# Patient Record
Sex: Female | Born: 1935 | Race: White | Hispanic: No | Smoking: Never smoker
Health system: Southern US, Community
[De-identification: ages and names within clinical notes are randomized; demographics above are authoritative.]

## PROBLEM LIST (undated history)

## (undated) DIAGNOSIS — E785 Hyperlipidemia, unspecified: Secondary | ICD-10-CM

## (undated) DIAGNOSIS — I10 Essential (primary) hypertension: Secondary | ICD-10-CM

## (undated) DIAGNOSIS — I2510 Atherosclerotic heart disease of native coronary artery without angina pectoris: Secondary | ICD-10-CM

## (undated) HISTORY — DX: Atherosclerotic heart disease of native coronary artery without angina pectoris: I25.10

## (undated) HISTORY — DX: Hyperlipidemia, unspecified: E78.5

---

## 2011-05-15 DIAGNOSIS — D039 Melanoma in situ, unspecified: Secondary | ICD-10-CM | POA: Insufficient documentation

## 2011-05-15 DIAGNOSIS — Z8719 Personal history of other diseases of the digestive system: Secondary | ICD-10-CM | POA: Insufficient documentation

## 2011-05-15 DIAGNOSIS — E785 Hyperlipidemia, unspecified: Secondary | ICD-10-CM | POA: Insufficient documentation

## 2011-06-17 DIAGNOSIS — K8020 Calculus of gallbladder without cholecystitis without obstruction: Secondary | ICD-10-CM | POA: Insufficient documentation

## 2014-02-13 ENCOUNTER — Emergency Department (HOSPITAL_COMMUNITY)
Admission: EM | Admit: 2014-02-13 | Discharge: 2014-02-13 | Disposition: A | Discharge status: Home / Self Care | Payer: Medicare Other | Attending: Emergency Medicine | Admitting: Emergency Medicine

## 2014-02-13 DIAGNOSIS — Z7982 Long term (current) use of aspirin: Secondary | ICD-10-CM | POA: Diagnosis not present

## 2014-02-13 DIAGNOSIS — Z79899 Other long term (current) drug therapy: Secondary | ICD-10-CM | POA: Insufficient documentation

## 2014-02-13 DIAGNOSIS — R55 Syncope and collapse: Secondary | ICD-10-CM | POA: Insufficient documentation

## 2014-02-13 LAB — COMPREHENSIVE METABOLIC PANEL
ALT: 11 U/L (ref 0–35)
AST: 18 U/L (ref 0–37)
Albumin: 4 g/dL (ref 3.5–5.2)
Alkaline Phosphatase: 65 U/L (ref 39–117)
Anion gap: 16 — ABNORMAL HIGH (ref 5–15)
BILIRUBIN TOTAL: 0.5 mg/dL (ref 0.3–1.2)
BUN: 16 mg/dL (ref 6–23)
CHLORIDE: 98 meq/L (ref 96–112)
CO2: 23 meq/L (ref 19–32)
Calcium: 9.2 mg/dL (ref 8.4–10.5)
Creatinine, Ser: 0.74 mg/dL (ref 0.50–1.10)
GFR calc non Af Amer: 75 mL/min — ABNORMAL LOW (ref >90)
GFR, EST AFRICAN AMERICAN: 87 mL/min — AB (ref >90)
GLUCOSE: 126 mg/dL — AB (ref 70–99)
POTASSIUM: 4.1 meq/L (ref 3.7–5.3)
Sodium: 137 mEq/L (ref 137–147)
TOTAL PROTEIN: 7.5 g/dL (ref 6.0–8.3)

## 2014-02-13 LAB — URINE MICROSCOPIC-ADD ON

## 2014-02-13 LAB — URINALYSIS, ROUTINE W REFLEX MICROSCOPIC
Bilirubin Urine: NEGATIVE
Glucose, UA: NEGATIVE mg/dL
Ketones, ur: NEGATIVE mg/dL
Nitrite: NEGATIVE
Protein, ur: NEGATIVE mg/dL
SPECIFIC GRAVITY, URINE: 1.008 (ref 1.005–1.030)
UROBILINOGEN UA: 0.2 mg/dL (ref 0.0–1.0)
pH: 7 (ref 5.0–8.0)

## 2014-02-13 LAB — CBC WITH DIFFERENTIAL/PLATELET
Basophils Absolute: 0 10*3/uL (ref 0.0–0.1)
Basophils Relative: 0 % (ref 0–1)
EOS ABS: 0.1 10*3/uL (ref 0.0–0.7)
Eosinophils Relative: 1 % (ref 0–5)
HCT: 40.4 % (ref 36.0–46.0)
HEMOGLOBIN: 13.4 g/dL (ref 12.0–15.0)
LYMPHS ABS: 1.4 10*3/uL (ref 0.7–4.0)
LYMPHS PCT: 18 % (ref 12–46)
MCH: 31.8 pg (ref 26.0–34.0)
MCHC: 33.2 g/dL (ref 30.0–36.0)
MCV: 96 fL (ref 78.0–100.0)
Monocytes Absolute: 0.5 10*3/uL (ref 0.1–1.0)
Monocytes Relative: 6 % (ref 3–12)
NEUTROS PCT: 75 % (ref 43–77)
Neutro Abs: 6.1 10*3/uL (ref 1.7–7.7)
Platelets: 173 10*3/uL (ref 150–400)
RBC: 4.21 MIL/uL (ref 3.87–5.11)
RDW: 13.1 % (ref 11.5–15.5)
WBC: 8 10*3/uL (ref 4.0–10.5)

## 2014-02-13 LAB — TROPONIN I: Troponin I: <0.3 ng/mL (ref <0.30)

## 2014-02-13 NOTE — Discharge Instructions (Signed)
Followup with primary doctor in the office this week for a recheck. Return to the ER for recurrent episodes of passing out.  Syncope Syncope is a medical term for fainting or passing out. This means you lose consciousness and drop to the ground. People are generally unconscious for less than 5 minutes. You may have some muscle twitches for up to 15 seconds before waking up and returning to normal. Syncope occurs more often in older adults, but it can happen to anyone. While most causes of syncope are not dangerous, syncope can be a sign of a serious medical problem. It is important to seek medical care.  CAUSES  Syncope is caused by a sudden drop in blood flow to the brain. The specific cause is often not determined. Factors that can bring on syncope include:  Taking medicines that lower blood pressure.  Sudden changes in posture, such as standing up quickly.  Taking more medicine than prescribed.  Standing in one place for too long.  Seizure disorders.  Dehydration and excessive exposure to heat.  Low blood sugar (hypoglycemia).  Straining to have a bowel movement.  Heart disease, irregular heartbeat, or other circulatory problems.  Fear, emotional distress, seeing blood, or severe pain. SYMPTOMS  Right before fainting, you may:  Feel dizzy or light-headed.  Feel nauseous.  See all white or all black in your field of vision.  Have cold, clammy skin. DIAGNOSIS  Your health care provider will ask about your symptoms, perform a physical exam, and perform an electrocardiogram (ECG) to record the electrical activity of your heart. Your health care provider may also perform other heart or blood tests to determine the cause of your syncope which may include:  Transthoracic echocardiogram (TTE). During echocardiography, sound waves are used to evaluate how blood flows through your heart.  Transesophageal echocardiogram (TEE).  Cardiac monitoring. This allows your health care  provider to monitor your heart rate and rhythm in real time.  Holter monitor. This is a portable device that records your heartbeat and can help diagnose heart arrhythmias. It allows your health care provider to track your heart activity for several days, if needed.  Stress tests by exercise or by giving medicine that makes the heart beat faster. TREATMENT  In most cases, no treatment is needed. Depending on the cause of your syncope, your health care provider may recommend changing or stopping some of your medicines. HOME CARE INSTRUCTIONS  Have someone stay with you until you feel stable.  Do not drive, use machinery, or play sports until your health care provider says it is okay.  Keep all follow-up appointments as directed by your health care provider.  Lie down right away if you start feeling like you might faint. Breathe deeply and steadily. Wait until all the symptoms have passed.  Drink enough fluids to keep your urine clear or pale yellow.  If you are taking blood pressure or heart medicine, get up slowly and take several minutes to sit and then stand. This can reduce dizziness. SEEK IMMEDIATE MEDICAL CARE IF:   You have a severe headache.  You have unusual pain in the chest, abdomen, or back.  You are bleeding from your mouth or rectum, or you have black or tarry stool.  You have an irregular or very fast heartbeat.  You have pain with breathing.  You have repeated fainting or seizure-like jerking during an episode.  You faint when sitting or lying down.  You have confusion.  You have trouble walking.  You have severe weakness.  You have vision problems. If you fainted, call your local emergency services (911 in [place] Do not drive yourself to the hospital.  MAKE SURE YOU:  Understand these instructions.  Will watch your condition.  Will get help right away if you are not doing well or get worse. Document Released: 06/23/2005 Document Revised: 06/28/2013  Document Reviewed: 08/22/2011 [facility] Patient Information 2015 [place], [place]. This information is not intended to replace advice given to you by your health care provider. Make sure you discuss any questions you have with your health care provider.

## 2014-02-13 NOTE — ED Notes (Signed)
Pt. Had a near syncopal episode while sitting in the chair this am.  Pt. Became diaphoretic and could not respond for approximately 10 minutes.  Reported by her daughter.  Pt. Stated, "I could hear my daughter talking but could not answer her."  When paramedics arrived pt. Was alert and oriented X4.  BP was 58 palpated and HR44-64, 1st degree block.  (pt. Is on Atenolol)  Pt. Received 500 cc bolus BP 137/60 presently.  Pt. Denies any pain is alert and oriented $. CBG 123

## 2014-02-13 NOTE — ED Notes (Signed)
Pt. Is using [facility]

## 2014-02-13 NOTE — ED Provider Notes (Signed)
CSN: [id number]     Arrival date & time 02/13/14  [id number] History   First MD Initiated Contact with Patient 02/13/14 [phone]     Chief Complaint  Patient presents with  . Near Syncope     (Consider location/radiation/quality/duration/timing/severity/associated sxs/prior Treatment) HPI Comments: Patient presents to the ER for evaluation of syncope. Patient has had similar episodes in the past. Daughter reports that the patient was sitting in a chair and suddenly slumped lowered. Each time that this has happened in the past, including today, she has had several yawns just prior to slumping forward. She was incontinent of urine. Daughter reports that she had her head slumped over and was unresponsive for a brief period of time. Patient reports that she could hear her daughter talking, but could not respond. EMS responded to the home, found her alert and oriented but weak. She had a blood pressure of 58 palp and heart rate in the 40s. She was administered IV fluid bolus during transport to the ER.  Upon arrival, patient is without complaints. She is awake, alert and oriented. She denies headache, weakness, dizziness, chest pain, shortness of breath.  Patient is a 80 y.o. female presenting with near-syncope.  Near Syncope    No past medical history on file. No past surgical history on file. No family history on file. History  Substance Use Topics  . Smoking status: Not on file  . Smokeless tobacco: Not on file  . Alcohol Use: Not on file   OB History   No data available     Review of Systems  Cardiovascular: Positive for near-syncope.  Neurological: Positive for syncope.  All other systems reviewed and are negative.     Allergies  Review of patient's allergies indicates no known allergies.  Home Medications   Prior to Admission medications   Medication Sig Start Date End Date Taking? Authorizing Provider  aspirin EC 81 MG tablet Take 81 mg by mouth 3 (three) times a week. No  specific days   Yes Historical Provider, MD  atenolol (TENORMIN) 25 MG tablet Take 25 mg by mouth 2 (two) times daily.   Yes Historical Provider, MD  Cyanocobalamin (VITAMIN B-12) 5000 MCG SUBL Take 5,000 mcg by mouth every Monday.   Yes Historical Provider, MD  hydrochlorothiazide (MICROZIDE) 12.5 MG capsule Take 6.25 mg by mouth daily.   Yes Historical Provider, MD  lovastatin (MEVACOR) 10 MG tablet Take 10 mg by mouth at bedtime.   Yes Historical Provider, MD   BP 143/54  Pulse 62  Temp(Src) 96.2 F (35.7 C) (Rectal)  Resp 18  SpO2 97% Physical Exam  Constitutional: She is oriented to person, place, and time. She appears well-developed and well-nourished. No distress.  HENT:  Head: Normocephalic and atraumatic.  Right Ear: Hearing normal.  Left Ear: Hearing normal.  Nose: Nose normal.  Mouth/Throat: Oropharynx is clear and moist and mucous membranes are normal.  Eyes: Conjunctivae and EOM are normal. Pupils are equal, round, and reactive to light.  Neck: Normal range of motion. Neck supple.  Cardiovascular: Regular rhythm, S1 normal and S2 normal.  Exam reveals no gallop and no friction rub.   No murmur heard. Pulmonary/Chest: Effort normal and breath sounds normal. No respiratory distress. She exhibits no tenderness.  Abdominal: Soft. Normal appearance and bowel sounds are normal. There is no hepatosplenomegaly. There is no tenderness. There is no rebound, no guarding, no tenderness at McBurney's point and negative Murphy's sign. No hernia.  Musculoskeletal: Normal range of motion.  Neurological: She is alert and oriented to person, place, and time. She has normal strength. No cranial nerve deficit or sensory deficit. Coordination normal. GCS eye subscore is 4. GCS verbal subscore is 5. GCS motor subscore is 6.  Skin: Skin is warm, dry and intact. No rash noted. No cyanosis.  Psychiatric: She has a normal mood and affect. Her speech is normal and behavior is normal. Thought content  normal.    ED Course  Procedures (including critical care time) Labs Review Labs Reviewed  COMPREHENSIVE METABOLIC PANEL - Abnormal; Notable for the following:    Glucose, Bld 126 (*)    GFR calc non Af Amer 75 (*)    GFR calc Af Amer 87 (*)    Anion gap 16 (*)    All other components within normal limits  URINALYSIS, ROUTINE W REFLEX MICROSCOPIC - Abnormal; Notable for the following:    Hgb urine dipstick TRACE (*)    Leukocytes, UA SMALL (*)    All other components within normal limits  URINE MICROSCOPIC-ADD ON - Abnormal; Notable for the following:    Squamous Epithelial / LPF FEW (*)    Bacteria, UA FEW (*)    All other components within normal limits  CBC WITH DIFFERENTIAL  TROPONIN I    Imaging Review No results found.   EKG Interpretation   Date/Time:  Monday February 13 2014 09:46:08 EDT Ventricular Rate:  67 PR Interval:  215 QRS Duration: 92 QT Interval:  444 QTC Calculation: 469 R Axis:   29 Text Interpretation:  Sinus rhythm Borderline prolonged PR interval  Otherwise within normal limits Confirmed by Abimael Zeiter  MD, Jazzlyn Huizenga  ([id number]) on 02/13/2014 10:23:48 AM      MDM   Final diagnoses:  Vasovagal syncope    Presented to the for evaluation of her near syncope or syncope. The patient had an episode she slumped over, that is that her head was and she wasn't responding, but patient reports that she could hear what was going on around her. There may have been urinary incontinence. Patient has had at least 2 other episodes similar to this, workup including EEG and MRI of her recent hospital in the last 6 months for these episodes.  Patient reports that every time it has happened it has happened immediately after yawning. Case was discussed with Doctor Amada Jupiter, on call for neurology. Specifically, we discussed whether this would likely represent a seizure or TIA/CVA. He did not feel it was consistent with any of these entities, did not recommend repeat  imaging such as MRI. Patient's cardiac workup has been unremarkable, no signs of arrhythmia. Doctor Amada Jupiter felt that the patient was most likely expressing vasovagal episode secondary to the yawning. The patient's blood pressure and heart rate seen by EMS to support this diagnosis. Patient was reassured, told to followup with primary doctor, possible carotid ultrasounds and further workup, but does not need to be performed emergently. Family was told that she can be brought back to the emergency department if she has any further episodes.   Gilda Crease, MD 02/13/14 [phone]

## 2016-03-03 ENCOUNTER — Ambulatory Visit (INDEPENDENT_AMBULATORY_CARE_PROVIDER_SITE_OTHER): Payer: Medicare Other

## 2016-03-03 ENCOUNTER — Encounter (HOSPITAL_COMMUNITY): Payer: Self-pay | Admitting: Emergency Medicine

## 2016-03-03 ENCOUNTER — Ambulatory Visit (HOSPITAL_COMMUNITY)
Admission: EM | Admit: 2016-03-03 | Discharge: 2016-03-03 | Disposition: A | Discharge status: Home / Self Care | Payer: Medicare Other | Attending: Internal Medicine | Admitting: Internal Medicine

## 2016-03-03 DIAGNOSIS — J209 Acute bronchitis, unspecified: Secondary | ICD-10-CM

## 2016-03-03 HISTORY — DX: Essential (primary) hypertension: I10

## 2016-03-03 MED ORDER — BENZONATATE 100 MG PO CAPS: 100.0000 mg | ORAL_CAPSULE | Freq: Three times a day (TID) | ORAL | 0 refills | Status: DC

## 2016-03-03 MED ORDER — AMOXICILLIN 250 MG PO CAPS: 250.0000 mg | ORAL_CAPSULE | Freq: Three times a day (TID) | ORAL | 0 refills | Status: DC

## 2016-03-03 NOTE — ED Provider Notes (Signed)
CSN: [id number]     Arrival date & time 03/03/16  1308 History   First MD Initiated Contact with Patient 03/03/16 1426     Chief Complaint  Patient presents with  . Cough   (Consider location/radiation/quality/duration/timing/severity/associated sxs/prior Treatment) HPI 82 y/o female with 1 week hx of cough, sputum production, and some sob. Away from home ([place]). Non smoker. No hx of asthma.  Past Medical History:  Diagnosis Date  . Hypertension    History reviewed. No pertinent surgical history. History reviewed. No pertinent family history. Social History  Substance Use Topics  . Smoking status: Never Smoker  . Smokeless tobacco: Never Used  . Alcohol use No   OB History    No data available     Review of Systems  Denies: HEADACHE, NAUSEA, ABDOMINAL PAIN, CHEST PAIN, CONGESTION, DYSURIA, SHORTNESS OF BREATH  Allergies  Review of patient's allergies indicates no known allergies.  Home Medications   Prior to Admission medications   Medication Sig Start Date End Date Taking? Authorizing Provider  atenolol (TENORMIN) 25 MG tablet Take 25 mg by mouth 2 (two) times daily.   Yes Historical Provider, MD  hydrochlorothiazide (MICROZIDE) 12.5 MG capsule Take 6.25 mg by mouth daily.   Yes Historical Provider, MD  lovastatin (MEVACOR) 10 MG tablet Take 10 mg by mouth at bedtime.   Yes Historical Provider, MD  aspirin EC 81 MG tablet Take 81 mg by mouth 3 (three) times a week. No specific days    Historical Provider, MD  Cyanocobalamin (VITAMIN B-12) 5000 MCG SUBL Take 5,000 mcg by mouth every Monday.    Historical Provider, MD   Meds Ordered and Administered this Visit  Medications - No data to display  BP 156/63 (BP Location: Left Arm)   Pulse 67   Temp 98 F (36.7 C) (Oral)   Resp 16   SpO2 98%  No data found.   Physical Exam NURSES NOTES AND VITAL SIGNS REVIEWED. CONSTITUTIONAL: Well developed, well nourished, no acute distress HEENT: normocephalic,  atraumatic EYES: Conjunctiva normal NECK:normal ROM, supple, no adenopathy PULMONARY:No respiratory distress, normal effort, few non-specific rhonchi.  ABDOMINAL: Soft, ND, NT BS+, No CVAT MUSCULOSKELETAL: Normal ROM of all extremities,  SKIN: warm and dry without rash PSYCHIATRIC: Mood and affect, behavior are normal  Urgent Care Course   Clinical Course    Procedures (including critical care time)  Labs Review Labs Reviewed - No data to display  Imaging Review No results found.   Visual Acuity Review  Right Eye Distance:   Left Eye Distance:   Bilateral Distance:    Right Eye Near:   Left Eye Near:    Bilateral Near:       Amoxil 250 mg 3 times a day, push fluids Tessalon perles.   MDM   1. Acute bronchitis, unspecified organism     Patient is reassured that there are no issues that require transfer to higher level of care at this time or additional tests. Patient is advised to continue home symptomatic treatment. Patient is advised that if there are new or worsening symptoms to attend the emergency department, contact primary care provider, or return to UC. Instructions of care provided discharged home in stable condition.    THIS NOTE WAS GENERATED USING A VOICE RECOGNITION SOFTWARE PROGRAM. ALL REASONABLE EFFORTS  WERE MADE TO PROOFREAD THIS DOCUMENT FOR ACCURACY.  I have verbally reviewed the discharge instructions with the patient. A printed AVS was given to the patient.  All questions were  answered prior to discharge.      Tharon AquasFrank C Patrick, PA 03/03/16 [phone]

## 2016-03-03 NOTE — ED Triage Notes (Signed)
[facility] a [facility] with chest congestion x 6 days.

## 2019-06-04 ENCOUNTER — Encounter (HOSPITAL_COMMUNITY): Payer: Self-pay | Admitting: Emergency Medicine

## 2019-06-04 ENCOUNTER — Inpatient Hospital Stay (HOSPITAL_COMMUNITY)
Admission: EM | Admit: 2019-06-04 | Discharge: 2019-06-08 | DRG: 247 | Disposition: A | Discharge status: Home / Self Care | Payer: Medicare Other | Attending: Family Medicine | Admitting: Family Medicine

## 2019-06-04 ENCOUNTER — Other Ambulatory Visit: Payer: Self-pay

## 2019-06-04 ENCOUNTER — Emergency Department (HOSPITAL_COMMUNITY): Payer: Medicare Other

## 2019-06-04 DIAGNOSIS — E871 Hypo-osmolality and hyponatremia: Secondary | ICD-10-CM | POA: Diagnosis present

## 2019-06-04 DIAGNOSIS — E7800 Pure hypercholesterolemia, unspecified: Secondary | ICD-10-CM

## 2019-06-04 DIAGNOSIS — Z955 Presence of coronary angioplasty implant and graft: Secondary | ICD-10-CM

## 2019-06-04 DIAGNOSIS — I959 Hypotension, unspecified: Secondary | ICD-10-CM | POA: Diagnosis present

## 2019-06-04 DIAGNOSIS — Z79899 Other long term (current) drug therapy: Secondary | ICD-10-CM

## 2019-06-04 DIAGNOSIS — Z20828 Contact with and (suspected) exposure to other viral communicable diseases: Secondary | ICD-10-CM | POA: Diagnosis present

## 2019-06-04 DIAGNOSIS — Z7982 Long term (current) use of aspirin: Secondary | ICD-10-CM

## 2019-06-04 DIAGNOSIS — I252 Old myocardial infarction: Secondary | ICD-10-CM

## 2019-06-04 DIAGNOSIS — I214 Non-ST elevation (NSTEMI) myocardial infarction: Principal | ICD-10-CM | POA: Diagnosis present

## 2019-06-04 DIAGNOSIS — E785 Hyperlipidemia, unspecified: Secondary | ICD-10-CM | POA: Diagnosis present

## 2019-06-04 DIAGNOSIS — R079 Chest pain, unspecified: Secondary | ICD-10-CM

## 2019-06-04 DIAGNOSIS — R002 Palpitations: Secondary | ICD-10-CM

## 2019-06-04 DIAGNOSIS — I10 Essential (primary) hypertension: Secondary | ICD-10-CM | POA: Diagnosis present

## 2019-06-04 LAB — BASIC METABOLIC PANEL
Anion gap: 12 (ref 5–15)
BUN: 18 mg/dL (ref 8–23)
CO2: 24 mmol/L (ref 22–32)
Calcium: 9.4 mg/dL (ref 8.9–10.3)
Chloride: 96 mmol/L — ABNORMAL LOW (ref 98–111)
Creatinine, Ser: 0.9 mg/dL (ref 0.44–1.00)
GFR calc Af Amer: >60 mL/min (ref >60)
GFR calc non Af Amer: 56 mL/min — ABNORMAL LOW (ref >60)
Glucose, Bld: 168 mg/dL — ABNORMAL HIGH (ref 70–99)
Potassium: 3.9 mmol/L (ref 3.5–5.1)
Sodium: 132 mmol/L — ABNORMAL LOW (ref 135–145)

## 2019-06-04 LAB — CBC
HCT: 36.2 % (ref 36.0–46.0)
Hemoglobin: 12.4 g/dL (ref 12.0–15.0)
MCH: 33.5 pg (ref 26.0–34.0)
MCHC: 34.3 g/dL (ref 30.0–36.0)
MCV: 97.8 fL (ref 80.0–100.0)
Platelets: 200 10*3/uL (ref 150–400)
RBC: 3.7 MIL/uL — ABNORMAL LOW (ref 3.87–5.11)
RDW: 12.6 % (ref 11.5–15.5)
WBC: 6.3 10*3/uL (ref 4.0–10.5)
nRBC: 0 % (ref 0.0–0.2)

## 2019-06-04 LAB — TROPONIN I (HIGH SENSITIVITY): Troponin I (High Sensitivity): 16 ng/L (ref <18)

## 2019-06-04 MED ORDER — SODIUM CHLORIDE 0.9% FLUSH: 3.0000 mL | Freq: Once | INTRAVENOUS | Status: DC

## 2019-06-04 NOTE — ED Triage Notes (Signed)
Pt c/o 7/10 mid cp and tachycardia for the past few hours, denies any SOB, nausea or vomiting.

## 2019-06-05 ENCOUNTER — Other Ambulatory Visit: Payer: Self-pay

## 2019-06-05 ENCOUNTER — Observation Stay (HOSPITAL_BASED_OUTPATIENT_CLINIC_OR_DEPARTMENT_OTHER): Payer: Medicare Other

## 2019-06-05 ENCOUNTER — Encounter (HOSPITAL_COMMUNITY): Payer: Self-pay | Admitting: Cardiovascular Disease

## 2019-06-05 DIAGNOSIS — I214 Non-ST elevation (NSTEMI) myocardial infarction: Principal | ICD-10-CM

## 2019-06-05 DIAGNOSIS — R55 Syncope and collapse: Secondary | ICD-10-CM | POA: Diagnosis not present

## 2019-06-05 DIAGNOSIS — I10 Essential (primary) hypertension: Secondary | ICD-10-CM

## 2019-06-05 DIAGNOSIS — Z79899 Other long term (current) drug therapy: Secondary | ICD-10-CM | POA: Diagnosis not present

## 2019-06-05 DIAGNOSIS — I959 Hypotension, unspecified: Secondary | ICD-10-CM | POA: Diagnosis present

## 2019-06-05 DIAGNOSIS — I252 Old myocardial infarction: Secondary | ICD-10-CM | POA: Diagnosis not present

## 2019-06-05 DIAGNOSIS — Z7982 Long term (current) use of aspirin: Secondary | ICD-10-CM | POA: Diagnosis not present

## 2019-06-05 DIAGNOSIS — E7800 Pure hypercholesterolemia, unspecified: Secondary | ICD-10-CM | POA: Diagnosis present

## 2019-06-05 DIAGNOSIS — R079 Chest pain, unspecified: Secondary | ICD-10-CM | POA: Diagnosis not present

## 2019-06-05 DIAGNOSIS — E871 Hypo-osmolality and hyponatremia: Secondary | ICD-10-CM | POA: Diagnosis present

## 2019-06-05 DIAGNOSIS — E785 Hyperlipidemia, unspecified: Secondary | ICD-10-CM | POA: Diagnosis present

## 2019-06-05 DIAGNOSIS — Z20828 Contact with and (suspected) exposure to other viral communicable diseases: Secondary | ICD-10-CM | POA: Diagnosis present

## 2019-06-05 DIAGNOSIS — I2510 Atherosclerotic heart disease of native coronary artery without angina pectoris: Secondary | ICD-10-CM | POA: Diagnosis not present

## 2019-06-05 LAB — ECHOCARDIOGRAM COMPLETE
Height: 64 in
Weight: 2400 oz

## 2019-06-05 LAB — GLUCOSE, CAPILLARY
Glucose-Capillary: 97 mg/dL (ref 70–99)
Glucose-Capillary: 144 mg/dL — ABNORMAL HIGH (ref 70–99)
Glucose-Capillary: 115 mg/dL — ABNORMAL HIGH (ref 70–99)

## 2019-06-05 LAB — HEPARIN LEVEL (UNFRACTIONATED): Heparin Unfractionated: 0.28 IU/mL — ABNORMAL LOW (ref 0.30–0.70)

## 2019-06-05 LAB — TROPONIN I (HIGH SENSITIVITY)
Troponin I (High Sensitivity): 78 ng/L — ABNORMAL HIGH (ref <18)
Troponin I (High Sensitivity): 1396 ng/L (ref <18)

## 2019-06-05 LAB — SARS CORONAVIRUS 2 (TAT 6-24 HRS): SARS Coronavirus 2: NEGATIVE

## 2019-06-05 MED ORDER — ONDANSETRON HCL 4 MG/2ML IJ SOLN: 4.0000 mg | Freq: Four times a day (QID) | INTRAMUSCULAR | Status: DC | PRN

## 2019-06-05 MED ORDER — SODIUM CHLORIDE 0.9 % IV SOLN: 250.0000 mL | INTRAVENOUS | Status: DC | PRN

## 2019-06-05 MED ORDER — ACETAMINOPHEN 325 MG PO TABS
650.0000 mg | ORAL_TABLET | ORAL | Status: DC | PRN
  Administered 2019-06-06: 650 mg via ORAL
  Filled 2019-06-05: qty 2

## 2019-06-05 MED ORDER — HEPARIN (PORCINE) 25000 UT/250ML-% IV SOLN
700.0000 [IU]/h | INTRAVENOUS | Status: DC
  Administered 2019-06-05 – 2019-06-06 (×2): 800 [IU]/h via INTRAVENOUS
  Filled 2019-06-05 (×2): qty 250

## 2019-06-05 MED ORDER — PRAVASTATIN SODIUM 10 MG PO TABS: 10.0000 mg | ORAL_TABLET | Freq: Every day | ORAL | Status: DC

## 2019-06-05 MED ORDER — HEPARIN SODIUM (PORCINE) 5000 UNIT/ML IJ SOLN
5000.0000 [IU] | Freq: Three times a day (TID) | INTRAMUSCULAR | Status: DC
  Administered 2019-06-05: 5000 [IU] via SUBCUTANEOUS
  Filled 2019-06-05: qty 1

## 2019-06-05 MED ORDER — ROSUVASTATIN CALCIUM 20 MG PO TABS
20.0000 mg | ORAL_TABLET | Freq: Every day | ORAL | Status: DC
  Administered 2019-06-05 – 2019-06-07 (×3): 20 mg via ORAL
  Filled 2019-06-05 (×4): qty 1

## 2019-06-05 MED ORDER — ASPIRIN 81 MG PO CHEW
324.0000 mg | CHEWABLE_TABLET | Freq: Once | ORAL | Status: AC
  Administered 2019-06-05: 05:00:00 324 mg via ORAL
  Filled 2019-06-05: qty 4

## 2019-06-05 MED ORDER — METOPROLOL SUCCINATE ER 25 MG PO TB24
25.0000 mg | ORAL_TABLET | Freq: Every day | ORAL | Status: DC
  Administered 2019-06-05: 25 mg via ORAL
  Filled 2019-06-05: qty 1

## 2019-06-05 MED ORDER — SODIUM CHLORIDE 0.9% FLUSH
3.0000 mL | Freq: Two times a day (BID) | INTRAVENOUS | Status: DC
  Administered 2019-06-05 – 2019-06-06 (×2): 3 mL via INTRAVENOUS

## 2019-06-05 MED ORDER — CARVEDILOL 6.25 MG PO TABS
6.2500 mg | ORAL_TABLET | Freq: Two times a day (BID) | ORAL | Status: DC
  Administered 2019-06-05 – 2019-06-08 (×6): 6.25 mg via ORAL
  Filled 2019-06-05 (×7): qty 1

## 2019-06-05 MED ORDER — CARVEDILOL 12.5 MG PO TABS: 12.5000 mg | ORAL_TABLET | Freq: Two times a day (BID) | ORAL | Status: DC

## 2019-06-05 MED ORDER — ASPIRIN EC 81 MG PO TBEC
81.0000 mg | DELAYED_RELEASE_TABLET | Freq: Every day | ORAL | Status: DC
  Administered 2019-06-06 – 2019-06-08 (×3): 81 mg via ORAL
  Filled 2019-06-05 (×3): qty 1

## 2019-06-05 MED ORDER — CARVEDILOL 6.25 MG PO TABS: 6.2500 mg | ORAL_TABLET | Freq: Two times a day (BID) | ORAL | Status: DC

## 2019-06-05 MED ORDER — CLOPIDOGREL BISULFATE 75 MG PO TABS
75.0000 mg | ORAL_TABLET | Freq: Every day | ORAL | Status: DC
  Administered 2019-06-05 – 2019-06-08 (×4): 75 mg via ORAL
  Filled 2019-06-05 (×4): qty 1

## 2019-06-05 MED ORDER — SODIUM CHLORIDE 0.9% FLUSH: 3.0000 mL | INTRAVENOUS | Status: DC | PRN

## 2019-06-05 MED ORDER — ISOSORBIDE MONONITRATE ER 30 MG PO TB24
30.0000 mg | ORAL_TABLET | Freq: Every day | ORAL | Status: DC
  Administered 2019-06-05 – 2019-06-06 (×2): 30 mg via ORAL
  Filled 2019-06-05 (×2): qty 1

## 2019-06-05 MED ORDER — NITROGLYCERIN 0.4 MG SL SUBL: 0.4000 mg | SUBLINGUAL_TABLET | SUBLINGUAL | Status: DC | PRN

## 2019-06-05 NOTE — Progress Notes (Signed)
PROGRESS NOTE  Daisy Becker JIR:[id number] DOB: 1924-12-12 DOA: 06/04/2019 PCP: System, Pcp Not In  HPI/Recap of past 24 hours: HPI from Dr Mora Bellman is a 85 y.o. female with medical history significant for hypertension, now presenting to ED for evaluation of intermittent chest discomfort, progressive dyspnea for the past couple of weeks. In the ED, patient is found to be afebrile, saturating well on room air, and hypertensive to 180 systolic.  EKG features a sinus rhythm and chest x-rays negative for acute cardiopulmonary disease. High-sensitivity troponin was normal at 16, then increased to 78.  ED physician discussed the case with cardiology who recommended medical admission, cardiac monitoring.  COVID-19 test pending   Today, patient denies any further chest pain, worsening shortness of breath, diaphoresis, nausea/vomiting, fever/chills.  Assessment/Plan: Principal Problem:   NSTEMI (non-ST elevated myocardial infarction) (HCC) Active Problems:   Pure hypercholesterolemia   NSTEMI Currently chest pain-free Troponin 16-->78-->1,396 Lipid panel/A1c pending EKG with no acute ST changes Echo with EF 60 to 65%, grade 1 diastolic dysfunction, no regional wall motion abnormality Cardiology consulted: Recommend cardiac catheterization, but patient refusing for now, wants to try medical management and if continues to have chest pain will require cardiac catheterization Start IV Heparin Continue aspirin, start Plavix, switch to rosuvastatin 20 mg daily, start Coreg 12.5 mg twice daily, imdur, NTG prn Telemetry, monitor closely    Hypertension Continue to hold home losartan/hydrochlorthiazide Start Coreg twice daily    Malnutrition Type:      Malnutrition Characteristics:      Nutrition Interventions:       Estimated body mass index is 25.75 kg/m as calculated from the following:   Height as of this encounter: 5\' 4"  (1.626 m).   Weight as of this  encounter: 68 kg.     Code Status: Full  Family Communication: None at bedside  Disposition Plan: To be determined   Consultants:  Cardiology  Procedures:  None  Antimicrobials:  None  DVT prophylaxis: IV heparin   Objective: Vitals:   06/05/19 0545 06/05/19 0700 06/05/19 0839 06/05/19 1406  BP: (!) 139/55 (!) 127/52 (!) 153/71   Pulse: 73  71 (P) 69  Resp: 16 20 20  (P) 20  Temp:   98.5 F (36.9 C) (P) 97.7 F (36.5 C)  TempSrc:   Oral (P) Tympanic  SpO2: 96%  99% (P) 97%  Weight:      Height:       No intake or output data in the 24 hours ending 06/05/19 1545 Filed Weights   06/04/19 2208  Weight: 68 kg    Exam:  General: NAD   Cardiovascular: S1, S2 present  Respiratory: CTAB  Abdomen: Soft, nontender, nondistended, bowel sounds present  Musculoskeletal: No bilateral pedal edema noted  Skin: Normal  Psychiatry: Normal mood   Data Reviewed: CBC: Recent Labs  Lab 06/04/19 2212  WBC 6.3  HGB 12.4  HCT 36.2  MCV 97.8  PLT 200   Basic Metabolic Panel: Recent Labs  Lab 06/04/19 2212  NA 132*  K 3.9  CL 96*  CO2 24  GLUCOSE 168*  BUN 18  CREATININE 0.90  CALCIUM 9.4   GFR: Estimated Creatinine Clearance: 37.8 mL/min (by C-G formula based on SCr of 0.9 mg/dL). Liver Function Tests: No results for input(s): AST, ALT, ALKPHOS, BILITOT, PROT, ALBUMIN in the last 168 hours. No results for input(s): LIPASE, AMYLASE in the last 168 hours. No results for input(s): AMMONIA in the last 168 hours. Coagulation Profile:  No results for input(s): INR, PROTIME in the last 168 hours. Cardiac Enzymes: No results for input(s): CKTOTAL, CKMB, CKMBINDEX, TROPONINI in the last 168 hours. BNP (last 3 results) No results for input(s): PROBNP in the last 8760 hours. HbA1C: No results for input(s): HGBA1C in the last 72 hours. CBG: Recent Labs  Lab 06/05/19 1154  GLUCAP 97   Lipid Profile: No results for input(s): CHOL, HDL, LDLCALC, TRIG,  CHOLHDL, LDLDIRECT in the last 72 hours. Thyroid Function Tests: No results for input(s): TSH, T4TOTAL, FREET4, T3FREE, THYROIDAB in the last 72 hours. Anemia Panel: No results for input(s): VITAMINB12, FOLATE, FERRITIN, TIBC, IRON, RETICCTPCT in the last 72 hours. Urine analysis:    Component Value Date/Time   COLORURINE YELLOW 02/13/2014 [place] 02/13/2014 1159   LABSPEC 1.008 02/13/2014 1159   PHURINE 7.0 02/13/2014 1159   GLUCOSEU NEGATIVE 02/13/2014 1159   HGBUR TRACE (A) 02/13/2014 1159   BILIRUBINUR NEGATIVE 02/13/2014 1159   KETONESUR NEGATIVE 02/13/2014 1159   PROTEINUR NEGATIVE 02/13/2014 1159   UROBILINOGEN 0.2 02/13/2014 1159   NITRITE NEGATIVE 02/13/2014 1159   LEUKOCYTESUR SMALL (A) 02/13/2014 1159   Sepsis Labs: @LABRCNTIP (procalcitonin:4,lacticidven:4)  )No results found for this or any previous visit (from the past 240 hour(s)).    Studies: Dg Chest 2 View  Result Date: 06/04/2019 CLINICAL DATA:  Chest pain EXAM: CHEST - 2 VIEW COMPARISON:  02/24/2016 FINDINGS: Heart and mediastinal contours are within normal limits. No focal opacities or effusions. No acute bony abnormality. IMPRESSION: No active cardiopulmonary disease. Electronically Signed   By: Rolm Baptise M.D.   On: 06/04/2019 22:36    Scheduled Meds: . [START ON 06/06/2019] aspirin EC  81 mg Oral Daily  . carvedilol  12.5 mg Oral BID WC  . clopidogrel  75 mg Oral Daily  . isosorbide mononitrate  30 mg Oral Daily  . rosuvastatin  20 mg Oral q1800  . sodium chloride flush  3 mL Intravenous Once  . sodium chloride flush  3 mL Intravenous Q12H    Continuous Infusions: . sodium chloride    . heparin 800 Units/hr (06/05/19 1515)     LOS: 0 days     Alma Friendly, MD [facility]  If 7PM-7AM, please contact night-coverage [email or web address] 06/05/2019, 3:45 PM

## 2019-06-05 NOTE — ED Notes (Signed)
Pt denies any CP at this time.  She states that when it occurred it was more of a pressure/burning sensation.  Denies any SOB, N/V.  Alert and oriented, pleasant without resp distress or pain responses.

## 2019-06-05 NOTE — Progress Notes (Signed)
ANTICOAGULATION CONSULT NOTE - Follow Up Consult  Pharmacy Consult for heparin Indication: NSTEMI  Labs: Recent Labs    06/04/19 2212 06/05/19 0055 06/05/19 1000 06/05/19 2303  HGB 12.4  --   --   --   HCT 36.2  --   --   --   PLT 200  --   --   --   HEPARINUNFRC  --   --   --  0.28*  CREATININE 0.90  --   --   --   TROPONINIHS 16 78* 1,396*  --     Assessment/Plan:  85yo female slightly subtherapeutic on heparin with initial dosing for NSTEMI, but per RN pt had taken her IV out to take a shower so had not been running +/- 1hr. Will continue gtt at current rate for now and check level with am labs.   Wynona Neat, PharmD, BCPS  06/06/2019,12:00 AM

## 2019-06-05 NOTE — ED Provider Notes (Signed)
[facility] EMERGENCY DEPARTMENT Provider Note   CSN: [id number] Arrival date & time: 06/04/19  2158     History   Chief Complaint Chief Complaint  Patient presents with  . Chest Pain    HPI Valkyrie Guardiola is a 85 y.o. female.     The history is provided by the patient.  Chest Pain Pain location:  Substernal area Pain quality: pressure   Pain radiates to:  Does not radiate Pain severity:  Moderate Onset quality:  Sudden Timing:  Constant Progression:  Resolved Chronicity:  New Relieved by:  Nothing Worsened by:  Nothing Associated symptoms: fatigue and nausea   Associated symptoms: no abdominal pain, no diaphoresis, no fever, no shortness of breath, no syncope and no vomiting   Patient with history of hypertension presents with chest pain and pressure.  She reports several hours ago she had onset of chest pain as well as her heart racing.  No syncope.  She did feel fatigued and nausea.  It has improved.  She reports a similar episode about 2 weeks ago.  No recent fevers or cough.  No known history of CAD/CVA  Past Medical History:  Diagnosis Date  . Hypertension     There are no active problems to display for this patient.   History reviewed. No pertinent surgical history.   OB History   No obstetric history on file.      Home Medications    Prior to Admission medications   Medication Sig Start Date End Date Taking? Authorizing Provider  amoxicillin (AMOXIL) 250 MG capsule Take 1 capsule (250 mg total) by mouth 3 (three) times daily. 03/03/16   Tharon Aquas, PA  aspirin EC 81 MG tablet Take 81 mg by mouth 3 (three) times a week. No specific days    [provider]  atenolol (TENORMIN) 25 MG tablet Take 25 mg by mouth 2 (two) times daily.    [provider]  benzonatate (TESSALON) 100 MG capsule Take 1 capsule (100 mg total) by mouth every 8 (eight) hours. 03/03/16   Tharon Aquas, PA  Cyanocobalamin (VITAMIN B-12)  5000 MCG SUBL Take 5,000 mcg by mouth every Monday.    [provider]  hydrochlorothiazide (MICROZIDE) 12.5 MG capsule Take 6.25 mg by mouth daily.    [provider]  lovastatin (MEVACOR) 10 MG tablet Take 10 mg by mouth at bedtime.    [provider]    Family History No family history on file.  Social History Social History   Tobacco Use  . Smoking status: Never Smoker  . Smokeless tobacco: Never Used  Substance Use Topics  . Alcohol use: No  . Drug use: No     Allergies   Patient has no known allergies.   Review of Systems Review of Systems  Constitutional: Positive for fatigue. Negative for diaphoresis and fever.  Respiratory: Negative for shortness of breath.   Cardiovascular: Positive for chest pain. Negative for syncope.  Gastrointestinal: Positive for nausea. Negative for abdominal pain and vomiting.  All other systems reviewed and are negative.    Physical Exam Updated Vital Signs BP (!) 168/74   Pulse 86   Temp 97.9 F (36.6 C) (Oral)   Resp 18   Ht 1.626 m (5\' 4" )   Wt 68 kg   SpO2 98%   BMI 25.75 kg/m   Physical Exam CONSTITUTIONAL: Elderly, appears younger than stated age HEAD: Normocephalic/atraumatic EYES: EOMI/PERRL ENMT: Mucous membranes moist NECK: supple no  meningeal signs SPINE/BACK:entire spine nontender CV: S1/S2 noted, no murmurs/rubs/gallops noted LUNGS: Lungs are clear to auscultation bilaterally, no apparent distress ABDOMEN: soft, nontender NEURO: Pt is awake/alert/appropriate, moves all extremitiesx4.  No facial droop.   EXTREMITIES: pulses normal/equalx4, full ROM SKIN: warm, color normal PSYCH: no abnormalities of mood noted, alert and oriented to situation   ED Treatments / Results  Labs (all labs ordered are listed, but only abnormal results are displayed) Labs Reviewed  BASIC METABOLIC PANEL - Abnormal; Notable for the following components:      Result Value   Sodium 132 (*)    Chloride  96 (*)    Glucose, Bld 168 (*)    GFR calc non Af Amer 56 (*)    All other components within normal limits  CBC - Abnormal; Notable for the following components:   RBC 3.70 (*)    All other components within normal limits  TROPONIN I (HIGH SENSITIVITY) - Abnormal; Notable for the following components:   Troponin I (High Sensitivity) 78 (*)    All other components within normal limits  SARS CORONAVIRUS 2 (TAT 6-24 HRS)  TROPONIN I (HIGH SENSITIVITY)    EKG EKG Interpretation  Date/Time:  Saturday June 04 2019 22:15:27 EST Ventricular Rate:  91 PR Interval:  194 QRS Duration: 80 QT Interval:  364 QTC Calculation: 447 R Axis:   36 Text Interpretation: Normal sinus rhythm Septal infarct , age undetermined Abnormal ECG No significant change since last tracing Confirmed by Ripley Fraise [phone]) on 06/05/2019 2:54:22 AM   Radiology Dg Chest 2 View  Result Date: 06/04/2019 CLINICAL DATA:  Chest pain EXAM: CHEST - 2 VIEW COMPARISON:  02/24/2016 FINDINGS: Heart and mediastinal contours are within normal limits. No focal opacities or effusions. No acute bony abnormality. IMPRESSION: No active cardiopulmonary disease. Electronically Signed   By: Rolm Baptise M.D.   On: 06/04/2019 22:36    Procedures Procedures   Medications Ordered in ED Medications  sodium chloride flush (NS) 0.9 % injection 3 mL (3 mLs Intravenous Not Given 06/05/19 0248)  aspirin chewable tablet 324 mg (has no administration in time range)     Initial Impression / Assessment and Plan / ED Course  I have reviewed the triage vital signs and the nursing notes.  Pertinent labs & imaging results that were available during my care of the patient were reviewed by me and considered in my medical decision making (see chart for details).        4:28 AM Patient is a well-appearing elderly female with palpitations and chest pain She is now symptom-free.  She reports recent history of similar symptoms.  Denies  known history of CAD.  She is overall well-appearing appears younger than stated age Her troponins did elevate while in the ER.  However I am more suspicious that this represents a tachycardic event at home that may have led to demand ischemia rather than true ACS.  Cardiology fellow Dr. McMechen Bing consulted via phone.  He recommends admission, aspirin, echocardiogram and telemetry monitoring If the patient has any new chest pain or EKG changes then would recommend instituting treatment for ACS  Discussed with Dr. Myna Hidalgo for admission  Final Clinical Impressions(s) / ED Diagnoses   Final diagnoses:  Chest pain, rule out acute myocardial infarction  Palpitations    ED Discharge Orders    None       Ripley Fraise, MD 06/05/19 0430

## 2019-06-05 NOTE — H&P (Addendum)
History and Physical    Daisy Becker JXB:[id number] DOB: 02/05/1925 DOA: 06/04/2019  PCP: System, Pcp Not In   Patient coming from: Home   Chief Complaint: Chest discomfort   HPI: Daisy Becker is a 85 y.o. female with medical history significant for hypertension, now presenting to emergency department for evaluation of chest discomfort.  Patient is accompanied by her daughter who assists with the history.  Patient reports that she had a brief episode 2 weeks ago where she felt as though her heart was racing.  This was self-limited and she did not seek evaluation at that time.  She has had decreased exercise tolerance recently, becoming short of breath and fatigued with less exertion than she used to tolerate, but was not experiencing any chest pain until tonight.  She had some vague fatigue and malaise around dinnertime, then began to develop a vague discomfort in the central chest later in the night, described as different from the palpitations she experienced 2 weeks ago.  Discomfort was localized to the central chest, not associated with shortness of breath, nausea, or diaphoresis, and with no alleviating or exacerbating factors identified.  After approximately 1 hour, the chest discomfort began to improve.  She denies any recent fevers or chills, denies any significant cough, and denies leg swelling or tenderness.  ED Course: Upon arrival to the ED, patient is found to be afebrile, saturating well on room air, and hypertensive to 180 systolic.  EKG features a sinus rhythm and chest x-rays negative for acute cardiopulmonary disease.  Chemistry panel is notable for a slight hyponatremia and CBC is unremarkable.  High-sensitivity troponin was normal at 16, then increased to 78.  ED physician discussed the case with cardiology who recommended medical admission, cardiac monitoring, aspirin, and echocardiogram.  Patient was treated with 324 mg of aspirin in the ED.  COVID-19 screening test has  not yet resulted.  Review of Systems:  All other systems reviewed and apart from HPI, are negative.  Past Medical History:  Diagnosis Date  . Hypertension     History reviewed. No pertinent surgical history.   reports that she has never smoked. She has never used smokeless tobacco. She reports that she does not drink alcohol or use drugs.  No Known Allergies  No family history on file.   Prior to Admission medications   Medication Sig Start Date End Date Taking? Authorizing Provider  losartan-hydrochlorothiazide (HYZAAR) 100-25 MG tablet Take 1 tablet by mouth daily. 04/15/19  Yes [provider]  lovastatin (MEVACOR) 10 MG tablet Take 10 mg by mouth at bedtime.   Yes [provider]  amoxicillin (AMOXIL) 250 MG capsule Take 1 capsule (250 mg total) by mouth 3 (three) times daily. 03/03/16   Tharon AquasPatrick, Frank C, PA  aspirin EC 81 MG tablet Take 81 mg by mouth 3 (three) times a week. No specific days    [provider]  atenolol (TENORMIN) 25 MG tablet Take 25 mg by mouth 2 (two) times daily.    [provider]  benzonatate (TESSALON) 100 MG capsule Take 1 capsule (100 mg total) by mouth every 8 (eight) hours. 03/03/16   Tharon AquasPatrick, Frank C, PA  Cyanocobalamin (VITAMIN B-12) 5000 MCG SUBL Take 5,000 mcg by mouth every Monday.    [provider]  hydrochlorothiazide (MICROZIDE) 12.5 MG capsule Take 6.25 mg by mouth daily.    [provider]    Physical Exam: Vitals:   06/04/19 2207 06/04/19 2208 06/05/19 0245  BP: (!) 179/85  Marland Kitchen(!)  168/74  Pulse: (!) 101  86  Resp: 18  18  Temp: 97.9 F (36.6 C)    TempSrc: Oral    SpO2: 99%  98%  Weight:  68 kg   Height:  5\' 4"  (1.626 m)     Constitutional: NAD, calm  Eyes: PERTLA, lids and conjunctivae normal ENMT: Mucous membranes are moist. Posterior pharynx clear of any exudate or lesions.   Neck: normal, supple, no masses, no thyromegaly Respiratory:  no wheezing, no crackles. Normal  respiratory effort. No accessory muscle use.  Cardiovascular: S1 & S2 heard, regular rate and rhythm. No extremity edema.   Abdomen: No distension, no tenderness, soft. Bowel sounds active.  Musculoskeletal: no clubbing / cyanosis. No joint deformity upper and lower extremities.   Skin: no significant rashes, lesions, ulcers. Warm, dry, well-perfused. Neurologic:  No facial asymmetry. Sensation intact. Moving all extremities.  Psychiatric: Alert and oriented to person, place, and situation. Very pleasant, cooperative.     Labs on Admission: I have personally reviewed following labs and imaging studies  CBC: Recent Labs  Lab 06/04/19 2212  WBC 6.3  HGB 12.4  HCT 36.2  MCV 97.8  PLT 778   Basic Metabolic Panel: Recent Labs  Lab 06/04/19 2212  NA 132*  K 3.9  CL 96*  CO2 24  GLUCOSE 168*  BUN 18  CREATININE 0.90  CALCIUM 9.4   GFR: Estimated Creatinine Clearance: 37.8 mL/min (by C-G formula based on SCr of 0.9 mg/dL). Liver Function Tests: No results for input(s): AST, ALT, ALKPHOS, BILITOT, PROT, ALBUMIN in the last 168 hours. No results for input(s): LIPASE, AMYLASE in the last 168 hours. No results for input(s): AMMONIA in the last 168 hours. Coagulation Profile: No results for input(s): INR, PROTIME in the last 168 hours. Cardiac Enzymes: No results for input(s): CKTOTAL, CKMB, CKMBINDEX, TROPONINI in the last 168 hours. BNP (last 3 results) No results for input(s): PROBNP in the last 8760 hours. HbA1C: No results for input(s): HGBA1C in the last 72 hours. CBG: No results for input(s): GLUCAP in the last 168 hours. Lipid Profile: No results for input(s): CHOL, HDL, LDLCALC, TRIG, CHOLHDL, LDLDIRECT in the last 72 hours. Thyroid Function Tests: No results for input(s): TSH, T4TOTAL, FREET4, T3FREE, THYROIDAB in the last 72 hours. Anemia Panel: No results for input(s): VITAMINB12, FOLATE, FERRITIN, TIBC, IRON, RETICCTPCT in the last 72 hours. Urine analysis:     Component Value Date/Time   COLORURINE YELLOW 02/13/2014 [place] 02/13/2014 1159   LABSPEC 1.008 02/13/2014 1159   PHURINE 7.0 02/13/2014 1159   GLUCOSEU NEGATIVE 02/13/2014 1159   HGBUR TRACE (A) 02/13/2014 1159   BILIRUBINUR NEGATIVE 02/13/2014 1159   KETONESUR NEGATIVE 02/13/2014 1159   PROTEINUR NEGATIVE 02/13/2014 1159   UROBILINOGEN 0.2 02/13/2014 1159   NITRITE NEGATIVE 02/13/2014 1159   LEUKOCYTESUR SMALL (A) 02/13/2014 1159   Sepsis Labs: @LABRCNTIP (procalcitonin:4,lacticidven:4) )No results found for this or any previous visit (from the past 240 hour(s)).   Radiological Exams on Admission: Dg Chest 2 View  Result Date: 06/04/2019 CLINICAL DATA:  Chest pain EXAM: CHEST - 2 VIEW COMPARISON:  02/24/2016 FINDINGS: Heart and mediastinal contours are within normal limits. No focal opacities or effusions. No acute bony abnormality. IMPRESSION: No active cardiopulmonary disease. Electronically Signed   By: Rolm Baptise M.D.   On: 06/04/2019 22:36    EKG: Independently reviewed. Sinus rhythm, no acute ST-T abnormality.   Assessment/Plan   1. Chest pain  - Presents with vague  chest discomfort, found to have unremarkable CXR, EKG with sinus rhythm and no acute ST-T changes, and HS troponin 16 and then 78  - She had an episode of palpitations 2 wks ago and transient tachyarrhythmia considered though patient reports current symptoms be much different  - Chest discomfort has nearly resolved by time of admission and cardiology advised against IV heparin, recommending ASA, cardiac monitoring, and echocardiogram  - ASA 324 mg given in ED  - Continue cardiac monitoring, trend troponin, repeat EKG, check echocardiogram, continue ASA and statin, start Toprol for BP-control    2. Hypertension  - SBP 168-179 in ED  - Treat with Toprol given concern for ACS     PPE: Mask, face shield  DVT prophylaxis: sq heparin  Code Status: Full for now; discussed with patient and  daughter in ED; they have never considered this before, remain unsure, and want some time to consider.  Family Communication: Daughter updated at bedside Consults called: None  Admission status: Observation     Briscoe Deutscher, MD [facility] Pager [phone]  If 7PM-7AM, please contact night-coverage [email or web address] Password [facility]  06/05/2019, 4:38 AM

## 2019-06-05 NOTE — Progress Notes (Signed)
  Echocardiogram 2D Echocardiogram has been performed.  Daisy Becker 06/05/2019, 9:57 AM

## 2019-06-05 NOTE — Consult Note (Signed)
Cardiology Consultation:   Patient ID: Daisy Becker MRN: [id number]; DOB: Feb 04, 1925  Admit date: 06/04/2019 Date of Consult: 06/05/2019  Primary Care Provider: System, Pcp Not In Primary Cardiologist: Skeet Latch, MD   Patient Profile:   Daisy Becker is a 85 y.o. female with a hx of hypertension who is being seen today for the evaluation of NSTEMI at the request of Dr. Myna Hidalgo.  History of Present Illness:   Daisy Becker reports several weeks of intermittent chest pressure.  It typically occurs at rest.  She denies any associated shortness of breath, nausea, or diaphoresis.  On the day prior to admission the episode lasted longer and was more intense so she sought emergency care.  In the ED troponin was initially negative and then rose to 78, followed by 1396.  EKG showed sinus rhythm with prior septal infarct but no acute ischemia.  She was admitted to the hospital service and cardiology was consulted for NSTEMI.  She currently feels well and denies chest pain.  Part of the time she lives in [place] with her daughter and walks up and down stairs.  She does note that she has been more short of breath lately with this activity.  In [place] she lives on one level and her breathing is much more stable.  She denies any lower extremity edema, orthopnea, or PND.  She had an episode of palpitations a couple weeks ago but none recently.  She denies syncope or presyncope.  Heart Pathway Score:     Past Medical History:  Diagnosis Date  . Hypertension     History reviewed. No pertinent surgical history.   Home Medications:  Prior to Admission medications   Medication Sig Start Date End Date Taking? Authorizing Provider  losartan-hydrochlorothiazide (HYZAAR) 100-25 MG tablet Take 1 tablet by mouth daily. 04/15/19  Yes [provider]  lovastatin (MEVACOR) 10 MG tablet Take 10 mg by mouth at bedtime.   Yes [provider]    Inpatient Medications: Scheduled  Meds: . [START ON 06/06/2019] aspirin EC  81 mg Oral Daily  . heparin  5,000 Units Subcutaneous Q8H  . metoprolol succinate  25 mg Oral Daily  . pravastatin  10 mg Oral q1800  . sodium chloride flush  3 mL Intravenous Once  . sodium chloride flush  3 mL Intravenous Q12H   Continuous Infusions: . sodium chloride     PRN Meds: sodium chloride, acetaminophen, nitroGLYCERIN, ondansetron (ZOFRAN) IV, sodium chloride flush  Allergies:   No Known Allergies  Social History:   Social History   Socioeconomic History  . Marital status: Widowed    Spouse name: Not on file  . Number of children: Not on file  . Years of education: Not on file  . Highest education level: Not on file  Occupational History  . Not on file  Social Needs  . Financial resource strain: Not on file  . Food insecurity    Worry: Not on file    Inability: Not on file  . Transportation needs    Medical: Not on file    Non-medical: Not on file  Tobacco Use  . Smoking status: Never Smoker  . Smokeless tobacco: Never Used  Substance and Sexual Activity  . Alcohol use: No  . Drug use: No  . Sexual activity: Never  Lifestyle  . Physical activity    Days per week: Not on file    Minutes per session: Not on file  . Stress: Not on file  Relationships  .  Social Musicianconnections    Talks on phone: Not on file    Gets together: Not on file    Attends religious service: Not on file    Active member of club or organization: Not on file    Attends meetings of clubs or organizations: Not on file    Relationship status: Not on file  . Intimate partner violence    Fear of current or ex partner: Not on file    Emotionally abused: Not on file    Physically abused: Not on file    Forced sexual activity: Not on file  Other Topics Concern  . Not on file  Social History Narrative  . Not on file    Family History:   Family History  Problem Relation Age of Onset  . Heart disease Daughter        pacemaker     ROS:   Please see the history of present illness.  All other ROS reviewed and negative.     Physical Exam/Data:   Vitals:   06/05/19 0505 06/05/19 0545 06/05/19 0700 06/05/19 0839  BP: (!) 149/69 (!) 139/55 (!) 127/52 (!) 153/71  Pulse: 79 73  71  Resp:  16 20 20   Temp:    98.5 F (36.9 C)  TempSrc:    Oral  SpO2:  96%  99%  Weight:      Height:       No intake or output data in the 24 hours ending 06/05/19 1406 Last 3 Weights 06/04/2019  Weight (lbs) 150 lb  Weight (kg) 68.04 kg     VS:  BP (!) 153/71 (BP Location: Right Arm)   Pulse 71   Temp 98.5 F (36.9 C) (Oral)   Resp 20   Ht 5\' 4"  (1.626 m)   Wt 68 kg   SpO2 99%   BMI 25.75 kg/m  , BMI Body mass index is 25.75 kg/m. GENERAL:  Well appearing HEENT: Pupils equal round and reactive, fundi not visualized, oral mucosa unremarkable NECK:  No jugular venous distention, waveform within normal limits, carotid upstroke brisk and symmetric, no bruits LUNGS:  Clear to auscultation bilaterally HEART:  RRR.  PMI not displaced or sustained,S1 and S2 within normal limits, no S3, no S4, no clicks, no rubs, no murmurs ABD:  Flat, positive bowel sounds normal in frequency in pitch, no bruits, no rebound, no guarding, no midline pulsatile mass, no hepatomegaly, no splenomegaly EXT:  2 plus pulses throughout, no edema, no cyanosis no clubbing SKIN:  No rashes no nodules NEURO:  Cranial nerves II through XII grossly intact, motor grossly intact throughout PSYCH:  Cognitively intact, oriented to person place and time  EKG:  The EKG was personally reviewed and demonstrates:  Sinus rhythm.  Rate 91 bpm.  Prior septal infarct Telemetry:  Telemetry was personally reviewed and demonstrates:  Sinus rhythm.  No events  Relevant CV Studies:  Echo 06/05/19: IMPRESSIONS   1. Left ventricular ejection fraction, by visual estimation, is 60 to 65%. The left ventricle has normal function. There is no left ventricular hypertrophy.  2. Left  ventricular diastolic parameters are consistent with Grade I diastolic dysfunction (impaired relaxation).  3. Global right ventricle has normal systolic function.The right ventricular size is normal. No increase in right ventricular wall thickness.  4. Left atrial size was normal.  5. Right atrial size was normal.  6. The mitral valve is normal in structure. Trace mitral valve regurgitation. No evidence of mitral stenosis.  7. The  tricuspid valve is normal in structure. Tricuspid valve regurgitation is mild.  8. The aortic valve is tricuspid. Aortic valve regurgitation is trivial. Mild aortic valve sclerosis without stenosis.  9. The pulmonic valve was normal in structure. Pulmonic valve regurgitation is not visualized. 10. Normal pulmonary artery systolic pressure. 11. The inferior vena cava is dilated in size with >50% respiratory variability, suggesting right atrial pressure of 8 mmHg.  Laboratory Data:  High Sensitivity Troponin:   Recent Labs  Lab 06/04/19 2212 06/05/19 0055 06/05/19 1000  TROPONINIHS 16 78* 1,396*     Chemistry Recent Labs  Lab 06/04/19 2212  NA 132*  K 3.9  CL 96*  CO2 24  GLUCOSE 168*  BUN 18  CREATININE 0.90  CALCIUM 9.4  GFRNONAA 56*  GFRAA >60  ANIONGAP 12    No results for input(s): PROT, ALBUMIN, AST, ALT, ALKPHOS, BILITOT in the last 168 hours. Hematology Recent Labs  Lab 06/04/19 2212  WBC 6.3  RBC 3.70*  HGB 12.4  HCT 36.2  MCV 97.8  MCH 33.5  MCHC 34.3  RDW 12.6  PLT 200   BNPNo results for input(s): BNP, PROBNP in the last 168 hours.  DDimer No results for input(s): DDIMER in the last 168 hours.   Radiology/Studies:  Dg Chest 2 View  Result Date: 06/04/2019 CLINICAL DATA:  Chest pain EXAM: CHEST - 2 VIEW COMPARISON:  02/24/2016 FINDINGS: Heart and mediastinal contours are within normal limits. No focal opacities or effusions. No acute bony abnormality. IMPRESSION: No active cardiopulmonary disease. Electronically Signed    By: Charlett Nose M.D.   On: 06/04/2019 22:36    Assessment and Plan:   # NSTEMI: # Hyperlipidemia: Hs troponin has increased to 1300.  She is currently chest pain-free but but has been experiencing angina at home.  Echo this admission revealed normal systolic function without wall motion abnormalities.  At baseline she is quite functional.  She would be a candidate for cardiac catheterization.  However she would like to avoid it if possible.  Continue aspirin and start clopidogrel 75 mg daily.  We will start heparin with plans to continue for 48 hours.  Switch to rosuvastatin 20 mg daily.  She will need lipids and a CMP in 6 to 8 weeks.  LDL goal less than 70 if she remains chest pain-free on this regimen then she will be okay for discharge.  Otherwise she will require cardiac catheterization.  # Essential hypertension:  Holding home losartan/HCTZ.  She is currently getting metoprolol and BP above goal of <130/80.  We will switch to carvedilol 12.5 mg twice daily.      For questions or updates, please contact [facility] Please consult [email or web address] for contact info under     Signed, Chilton Si, MD  06/05/2019 2:06 PM

## 2019-06-05 NOTE — Progress Notes (Signed)
ANTICOAGULATION CONSULT NOTE - Initial Consult  Pharmacy Consult for Heparin Indication: chest pain/ACS  No Known Allergies  Patient Measurements: Height: 5\' 4"  (162.6 cm) Weight: 150 lb (68 kg) IBW/kg (Calculated) : 54.7 Heparin Dosing Weight: 66.3 kg  Vital Signs: Temp: 98.5 F (36.9 C) (11/29 0839) Temp Source: Oral (11/29 0839) BP: 153/71 (11/29 0839) Pulse Rate: 71 (11/29 0839)  Labs: Recent Labs    06/04/19 2212 06/05/19 0055 06/05/19 1000  HGB 12.4  --   --   HCT 36.2  --   --   PLT 200  --   --   CREATININE 0.90  --   --   TROPONINIHS 16 78* 1,396*    Estimated Creatinine Clearance: 37.8 mL/min (by C-G formula based on SCr of 0.9 mg/dL).   Medical History: Past Medical History:  Diagnosis Date  . Hypertension     Assessment: 55 YOF presents with NSTEMI now starting heparin. No anticoagulation PTA. Patient received a dose of SQ Heparin 5000 units 2 hours ago. CBC yesterday wnl. No overt bleeding noted. Will not bolus at this time given age and recent SQ heparin.  Goal of Therapy:  Heparin level 0.3-0.7 units/ml Monitor platelets by anticoagulation protocol: Yes   Plan:  Start Heparin IV at 800 units/hr Check 8-hr heparin level Daily heparin level and CBC Monitor for signs of bleeding  Richardine Service, PharmD PGY1 Pharmacy Resident Phone: [phone] 06/05/2019  2:21 PM  Please check [email or web address] for unit-specific pharmacy phone numbers.

## 2019-06-05 NOTE — Progress Notes (Signed)
Called into room by NT Toy and found patient sitting on the floor of the bathroom. Patient stated she just sat down and did not fall. Patients grandson, who had been visiting all day, stated that she just sat down on the floor. "She does this all the time, she is stubborn." He said that she want to do everything by herself and will not ask or accept help from any one. We repeatedly offered help and stated let us help you get up, patient refused.  Patient stated repeatedly, "please leave me alone, I'm fine." stated she wanted to clean up and did not need any help. She reached over and pulled the bathroom door closed. Reported to charge nurses from day and night shift. Reported to Trilby Drummer RN, Soil scientist. NT called to stay in room and assist patient back to bed after she came out of the bathroom.

## 2019-06-05 NOTE — ED Notes (Signed)
Breakfast tray ordered 

## 2019-06-06 ENCOUNTER — Other Ambulatory Visit: Payer: Self-pay

## 2019-06-06 LAB — HEMOGLOBIN A1C
Hgb A1c MFr Bld: 6 % — ABNORMAL HIGH (ref 4.8–5.6)
Mean Plasma Glucose: 125.5 mg/dL

## 2019-06-06 LAB — CBC
HCT: 30.6 % — ABNORMAL LOW (ref 36.0–46.0)
Hemoglobin: 10.4 g/dL — ABNORMAL LOW (ref 12.0–15.0)
MCH: 32.8 pg (ref 26.0–34.0)
MCHC: 34 g/dL (ref 30.0–36.0)
MCV: 96.5 fL (ref 80.0–100.0)
Platelets: 164 10*3/uL (ref 150–400)
RBC: 3.17 MIL/uL — ABNORMAL LOW (ref 3.87–5.11)
RDW: 12.6 % (ref 11.5–15.5)
WBC: 7.3 10*3/uL (ref 4.0–10.5)
nRBC: 0 % (ref 0.0–0.2)

## 2019-06-06 LAB — BASIC METABOLIC PANEL
Anion gap: 12 (ref 5–15)
BUN: 22 mg/dL (ref 8–23)
CO2: 24 mmol/L (ref 22–32)
Calcium: 8.6 mg/dL — ABNORMAL LOW (ref 8.9–10.3)
Chloride: 96 mmol/L — ABNORMAL LOW (ref 98–111)
Creatinine, Ser: 0.92 mg/dL (ref 0.44–1.00)
GFR calc Af Amer: >60 mL/min (ref >60)
GFR calc non Af Amer: 54 mL/min — ABNORMAL LOW (ref >60)
Glucose, Bld: 109 mg/dL — ABNORMAL HIGH (ref 70–99)
Potassium: 3.7 mmol/L (ref 3.5–5.1)
Sodium: 132 mmol/L — ABNORMAL LOW (ref 135–145)

## 2019-06-06 LAB — LIPID PANEL
Cholesterol: 161 mg/dL (ref 0–200)
HDL: 68 mg/dL (ref >40)
LDL Cholesterol: 86 mg/dL (ref 0–99)
Total CHOL/HDL Ratio: 2.4 RATIO
Triglycerides: 37 mg/dL (ref <150)
VLDL: 7 mg/dL (ref 0–40)

## 2019-06-06 LAB — GLUCOSE, CAPILLARY
Glucose-Capillary: 101 mg/dL — ABNORMAL HIGH (ref 70–99)
Glucose-Capillary: 137 mg/dL — ABNORMAL HIGH (ref 70–99)

## 2019-06-06 LAB — HEPARIN LEVEL (UNFRACTIONATED): Heparin Unfractionated: 0.67 IU/mL (ref 0.30–0.70)

## 2019-06-06 NOTE — Progress Notes (Signed)
Patient has not complained of cp throughout the night.  I will keep monitoring patient.

## 2019-06-06 NOTE — Progress Notes (Signed)
Patient had a syncopal episode at 1800, BP 82/48.  Patient was seated in a recliner.  Patient recovered quickly, BP at 1805 was 101/55.  Patient alert, oriented X 4. Patient did not fall during this episodne.  MD was in attendance.  Roselee Nova

## 2019-06-06 NOTE — Progress Notes (Signed)
PROGRESS NOTE  Daisy Becker UEA:[id number] DOB: June 09, 1925 DOA: 06/04/2019 PCP: System, Pcp Not In  HPI/Recap of past 24 hours: HPI from Dr Janeth Rase is a 85 y.o. female with medical history significant for hypertension, now presenting to ED for evaluation of intermittent chest discomfort, progressive dyspnea for the past couple of weeks. In the ED, patient is found to be afebrile, saturating well on room air, and hypertensive to 478 systolic.  EKG features a sinus rhythm and chest x-rays negative for acute cardiopulmonary disease. High-sensitivity troponin was normal at 16, then increased to 78.  ED physician discussed the case with cardiology who recommended medical admission, cardiac monitoring.  COVID-19 test pending   Overnight, patient noted to be dizzy/lightheaded last night due to hypotensive episode.  Continues to deny any further chest pain, worsening shortness of breath, abdominal pain, nausea/vomiting, fever/chills.  Assessment/Plan: Principal Problem:   NSTEMI (non-ST elevated myocardial infarction) ([facility]) Active Problems:   Pure hypercholesterolemia   NSTEMI Currently chest pain-free Troponin 16-->78-->1,396 LDL 86, A1c 6 EKG with no acute ST changes Echo with EF 60 to 29%, grade 1 diastolic dysfunction, no regional wall motion abnormality Cardiology consulted: Recommend cardiac catheterization, but patient refusing for now, wants to try medical management and if continues to have chest pain will require cardiac catheterization Continue IV Heparin for total of 48 hours Continue aspirin, Plavix, rosuvastatin, Coreg, imdur, NTG prn (will leave med adjustments for cardiology if need be) Telemetry, monitor closely    Hypertension Continue to hold home losartan/hydrochlorthiazide (may discontinue upon discharge) Continue meds as above    Malnutrition Type:      Malnutrition Characteristics:      Nutrition Interventions:       Estimated  body mass index is 25.54 kg/m as calculated from the following:   Height as of this encounter: 5\' 4"  (1.626 m).   Weight as of this encounter: 67.5 kg.     Code Status: Full  Family Communication: None at bedside  Disposition Plan: To be determined   Consultants:  Cardiology  Procedures:  None  Antimicrobials:  None  DVT prophylaxis: IV heparin   Objective: Vitals:   06/05/19 2048 06/05/19 2108 06/05/19 2155 06/06/19 0627  BP: (!) 93/44 (!) 112/56 119/66 102/60  Pulse: 73 73  72  Resp: 19   19  Temp: 97.8 F (36.6 C)   98.6 F (37 C)  TempSrc: Oral   Oral  SpO2: 96%   96%  Weight:    67.5 kg  Height:        Intake/Output Summary (Last 24 hours) at 06/06/2019 1210 Last data filed at 06/06/2019 0300 Gross per 24 hour  Intake 310.48 ml  Output -  Net 310.48 ml   Filed Weights   06/04/19 2208 06/06/19 0627  Weight: 68 kg 67.5 kg    Exam:  General: NAD   Cardiovascular: S1, S2 present  Respiratory: CTAB  Abdomen: Soft, nontender, nondistended, bowel sounds present  Musculoskeletal: No bilateral pedal edema noted  Skin: Normal  Psychiatry: Normal mood   Data Reviewed: CBC: Recent Labs  Lab 06/04/19 2212 06/06/19 0358  WBC 6.3 7.3  HGB 12.4 10.4*  HCT 36.2 30.6*  MCV 97.8 96.5  PLT 200 562   Basic Metabolic Panel: Recent Labs  Lab 06/04/19 2212 06/06/19 0358  NA 132* 132*  K 3.9 3.7  CL 96* 96*  CO2 24 24  GLUCOSE 168* 109*  BUN 18 22  CREATININE 0.90 0.92  CALCIUM 9.4 8.6*  GFR: Estimated Creatinine Clearance: 36.8 mL/min (by C-G formula based on SCr of 0.92 mg/dL). Liver Function Tests: No results for input(s): AST, ALT, ALKPHOS, BILITOT, PROT, ALBUMIN in the last 168 hours. No results for input(s): LIPASE, AMYLASE in the last 168 hours. No results for input(s): AMMONIA in the last 168 hours. Coagulation Profile: No results for input(s): INR, PROTIME in the last 168 hours. Cardiac Enzymes: No results for input(s):  CKTOTAL, CKMB, CKMBINDEX, TROPONINI in the last 168 hours. BNP (last 3 results) No results for input(s): PROBNP in the last 8760 hours. HbA1C: Recent Labs    06/06/19 0358  HGBA1C 6.0*   CBG: Recent Labs  Lab 06/05/19 1154 06/05/19 1652 06/05/19 2051 06/06/19 0751  GLUCAP 97 144* 115* 101*   Lipid Profile: Recent Labs    06/06/19 0358  CHOL 161  HDL 68  LDLCALC 86  TRIG 37  CHOLHDL 2.4   Thyroid Function Tests: No results for input(s): TSH, T4TOTAL, FREET4, T3FREE, THYROIDAB in the last 72 hours. Anemia Panel: No results for input(s): VITAMINB12, FOLATE, FERRITIN, TIBC, IRON, RETICCTPCT in the last 72 hours. Urine analysis:    Component Value Date/Time   COLORURINE YELLOW 02/13/2014 1159   APPEARANCEUR CLEAR 02/13/2014 1159   LABSPEC 1.008 02/13/2014 1159   PHURINE 7.0 02/13/2014 1159   GLUCOSEU NEGATIVE 02/13/2014 1159   HGBUR TRACE (A) 02/13/2014 1159   BILIRUBINUR NEGATIVE 02/13/2014 1159   KETONESUR NEGATIVE 02/13/2014 1159   PROTEINUR NEGATIVE 02/13/2014 1159   UROBILINOGEN 0.2 02/13/2014 1159   NITRITE NEGATIVE 02/13/2014 1159   LEUKOCYTESUR SMALL (A) 02/13/2014 1159   Sepsis Labs: @LABRCNTIP (procalcitonin:4,lacticidven:4)  ) Recent Results (from the past 240 hour(s))  SARS CORONAVIRUS 2 (TAT 6-24 HRS) Nasopharyngeal Nasopharyngeal Swab     Status: None   Collection Time: 06/05/19  5:11 AM   Specimen: Nasopharyngeal Swab  Result Value Ref Range Status   SARS Coronavirus 2 NEGATIVE NEGATIVE Final    Comment: (NOTE) SARS-CoV-2 target nucleic acids are NOT DETECTED. The SARS-CoV-2 RNA is generally detectable in upper and lower respiratory specimens during the acute phase of infection. Negative results do not preclude SARS-CoV-2 infection, do not rule out co-infections with other pathogens, and should not be used as the sole basis for treatment or other patient management decisions. Negative results must be combined with clinical observations,  patient history, and epidemiological information. The expected result is Negative. Fact Sheet for Patients: 06/07/19 Fact Sheet for Healthcare Providers: HairSlick.no This test is not yet approved or cleared by the quierodirigir.com FDA and  has been authorized for detection and/or diagnosis of SARS-CoV-2 by FDA under an Emergency Use Authorization (EUA). This EUA will remain  in effect (meaning this test can be used) for the duration of the COVID-19 declaration under Section 56 4(b)(1) of the Act, 21 U.S.C. section 360bbb-3(b)(1), unless the authorization is terminated or revoked sooner. Performed at [facility], [address], [place], [place]       Studies: No results found.  Scheduled Meds: . aspirin EC  81 mg Oral Daily  . carvedilol  6.25 mg Oral BID WC  . clopidogrel  75 mg Oral Daily  . isosorbide mononitrate  30 mg Oral Daily  . rosuvastatin  20 mg Oral q1800  . sodium chloride flush  3 mL Intravenous Once  . sodium chloride flush  3 mL Intravenous Q12H    Continuous Infusions: . sodium chloride    . heparin 800 Units/hr (06/05/19 2108)  LOS: 1 day     Briant CedarNkeiruka J Ezenduka, MD [facility]  If 7PM-7AM, please contact night-coverage [email or web address] 06/06/2019, 12:10 PM

## 2019-06-06 NOTE — Progress Notes (Addendum)
Progress Note  Patient Name: Daisy Becker Date of Encounter: 06/06/2019  Primary Cardiologist: Skeet Latch, MD   Subjective   Patient is feeling good this AM. Her last event of brief chest pressures was 24 hours ago. No SOB. Would still prefer medical management over cath at this point. BP was low yesterday and patient felt dizzy and lightheaded.   Inpatient Medications    Scheduled Meds: . aspirin EC  81 mg Oral Daily  . carvedilol  6.25 mg Oral BID WC  . clopidogrel  75 mg Oral Daily  . isosorbide mononitrate  30 mg Oral Daily  . rosuvastatin  20 mg Oral q1800  . sodium chloride flush  3 mL Intravenous Once  . sodium chloride flush  3 mL Intravenous Q12H   Continuous Infusions: . sodium chloride    . heparin 800 Units/hr (06/05/19 2108)   PRN Meds: sodium chloride, acetaminophen, nitroGLYCERIN, ondansetron (ZOFRAN) IV, sodium chloride flush   Vital Signs    Vitals:   06/05/19 2048 06/05/19 2108 06/05/19 2155 06/06/19 0627  BP: (!) 93/44 (!) 112/56 119/66 102/60  Pulse: 73 73  72  Resp: 19   19  Temp: 97.8 F (36.6 C)   98.6 F (37 C)  TempSrc: Oral   Oral  SpO2: 96%   96%  Weight:    67.5 kg  Height:        Intake/Output Summary (Last 24 hours) at 06/06/2019 0758 Last data filed at 06/06/2019 0300 Gross per 24 hour  Intake 310.48 ml  Output -  Net 310.48 ml   Last 3 Weights 06/06/2019 06/04/2019  Weight (lbs) 148 lb 12.8 oz 150 lb  Weight (kg) 67.495 kg 68.04 kg      Telemetry    NSR, HR 60-70s - Personally Reviewed  ECG    No new - Personally Reviewed  Physical Exam   GEN: No acute distress.   Neck: No JVD Cardiac: RRR, no murmurs, rubs, or gallops.  Respiratory: Clear to auscultation bilaterally. GI: Soft, nontender, non-distended  MS: No edema; No deformity. Neuro:  Nonfocal  Psych: Normal affect   Labs    High Sensitivity Troponin:   Recent Labs  Lab 06/04/19 2212 06/05/19 0055 06/05/19 1000  TROPONINIHS 16 78*  1,396*      Chemistry Recent Labs  Lab 06/04/19 2212 06/06/19 0358  NA 132* 132*  K 3.9 3.7  CL 96* 96*  CO2 24 24  GLUCOSE 168* 109*  BUN 18 22  CREATININE 0.90 0.92  CALCIUM 9.4 8.6*  GFRNONAA 56* 54*  GFRAA >60 >60  ANIONGAP 12 12     Hematology Recent Labs  Lab 06/04/19 2212 06/06/19 0358  WBC 6.3 7.3  RBC 3.70* 3.17*  HGB 12.4 10.4*  HCT 36.2 30.6*  MCV 97.8 96.5  MCH 33.5 32.8  MCHC 34.3 34.0  RDW 12.6 12.6  PLT 200 164    BNPNo results for input(s): BNP, PROBNP in the last 168 hours.   DDimer No results for input(s): DDIMER in the last 168 hours.   Radiology    Dg Chest 2 View  Result Date: 06/04/2019 CLINICAL DATA:  Chest pain EXAM: CHEST - 2 VIEW COMPARISON:  02/24/2016 FINDINGS: Heart and mediastinal contours are within normal limits. No focal opacities or effusions. No acute bony abnormality. IMPRESSION: No active cardiopulmonary disease. Electronically Signed   By: Rolm Baptise M.D.   On: 06/04/2019 22:36    Cardiac Studies   Echo 06/05/19 1. Left ventricular ejection  fraction, by visual estimation, is 60 to 65%. The left ventricle has normal function. There is no left ventricular hypertrophy.  2. Left ventricular diastolic parameters are consistent with Grade I diastolic dysfunction (impaired relaxation).  3. Global right ventricle has normal systolic function.The right ventricular size is normal. No increase in right ventricular wall thickness.  4. Left atrial size was normal.  5. Right atrial size was normal.  6. The mitral valve is normal in structure. Trace mitral valve regurgitation. No evidence of mitral stenosis.  7. The tricuspid valve is normal in structure. Tricuspid valve regurgitation is mild.  8. The aortic valve is tricuspid. Aortic valve regurgitation is trivial. Mild aortic valve sclerosis without stenosis.  9. The pulmonic valve was normal in structure. Pulmonic valve regurgitation is not visualized. 10. Normal pulmonary  artery systolic pressure. 11. The inferior vena cava is dilated in size with >50% respiratory variability, suggesting right atrial pressure of 8 mmHg.   Patient Profile     85 y.o. female with history of HTN who was admitted for NSTEMI  Assessment & Plan    NSTEMI Patient was admitted with intermittent chest pressure at rest. Troponin peak at 1396. Echo showed normal EF without wall motion abnormalities. Patient would like to avoid cardiac cath if possible.  - Heparin was started with plans to continue for 48 hours - Her last episode of chest pressure was yesterday morning. No SOB. - Aspirin and Plavix started - lovastatin changed to rosuvastatin  - Imdur 30 mg started>> pressures soft. Continue to monitor - Consider Cardiac CT. MD to see.  HTN - Losartan/HCTA held - metoprolol switched to carvedilol 12.5 mg BID - Imdur 30 mg - Yesterday systolic went into the 90s and patient felt dizzy/lightheaded. BP today 102/60. Continue to monitor. It continues to be symptomatic might need to decrease/discontinue medications. MD to see  HLD - continue rosuvstatin - LDL 86, goal <70  For questions or updates, please contact [facility] Please consult [email or web address] for contact info under        Signed, Cadence David Stall, PA-C  06/06/2019, 7:58 AM    ---------------------------------------------------------------------------------------------   History and all data above reviewed.  Patient examined.  I agree with the findings as above.  Daisy Becker was feeling well today until approximately 6 pm when she experienced a syncopal episode with prodrome. This is similar to her presyncopal episode yesterday she has a known history of vasovagal syncope, and these episodes occur approximately 1 x per year.   Constitutional: No acute distress Eyes: pupils equally round and reactive to light, sclera non-icteric, normal conjunctiva and lids ENMT: normal dentition, moist mucous membranes  Cardiovascular: regular rhythm, normal rate, no murmurs. S1 and S2 normal. Radial pulses normal bilaterally. No jugular venous distention.  Respiratory: clear to auscultation bilaterally GI : normal bowel sounds, soft and nontender. No distention.   MSK: extremities warm, well perfused. No edema.  NEURO: grossly nonfocal exam, moves all extremities. PSYCH: alert and oriented x 3, normal mood and affect.   All available labs, radiology testing, previous records reviewed. Agree with documented assessment and plan of my colleague as stated above with the following additions or changes:  Principal Problem:   NSTEMI (non-ST elevated myocardial infarction) ([facility]) Active Problems:   Pure hypercholesterolemia    Plan: She had a syncopal episode while sitting in the chair for which I was present for the evaluation, and had presyncope yesterday. I am concerned that she may be hypotensive on her new BP  regimen. I will hold the long acting nitrate, and possible hold the coreg tomorrow if BP still soft.   We will discuss indications for cath tomorrow, however she has had hypotension today, so we will no rush to angiography as she may not tolerate sedation in that setting.   Time Spent Directly with Patient:  I have spent a total of 45 minutes with the patient reviewing hospital notes, telemetry, EKGs, labs and examining the patient as well as establishing an assessment and plan that was discussed personally with the patient.  > 50% of time was spent in direct patient care.  Length of Stay:  LOS: 1 day   Parke PoissonGayatri A Chezney Huether, MD [facility] 7:38 PM  06/06/2019

## 2019-06-06 NOTE — Progress Notes (Signed)
ANTICOAGULATION CONSULT NOTE   Pharmacy Consult for Heparin Indication: chest pain/ACS  No Known Allergies  Patient Measurements: Height: 5\' 4"  (162.6 cm) Weight: 148 lb 12.8 oz (67.5 kg) IBW/kg (Calculated) : 54.7 Heparin Dosing Weight: 66.3 kg  Vital Signs: Temp: 98.6 F (37 C) (11/30 0627) Temp Source: Oral (11/30 0627) BP: 102/60 (11/30 0627) Pulse Rate: 72 (11/30 0627)  Labs: Recent Labs    06/04/19 2212 06/05/19 0055 06/05/19 1000 06/05/19 2303 06/06/19 0358  HGB 12.4  --   --   --  10.4*  HCT 36.2  --   --   --  30.6*  PLT 200  --   --   --  164  HEPARINUNFRC  --   --   --  0.28* 0.67  CREATININE 0.90  --   --   --  0.92  TROPONINIHS 16 78* 1,396*  --   --     Estimated Creatinine Clearance: 36.8 mL/min (by C-G formula based on SCr of 0.92 mg/dL).   Medical History: Past Medical History:  Diagnosis Date  . Hypertension     Assessment: 84 YOF presents with NSTEMI on heparin. No anticoagulation PTA. Plans are for 48 hours of heparin -heparin level at goal  Goal of Therapy:  Heparin level 0.3-0.7 units/ml Monitor platelets by anticoagulation protocol: Yes   Plan:  -No heparin changes needed -Daily heparin level and CBC -Anticipate to continue heparin until 12/1  Hildred Laser, PharmD Clinical Pharmacist **Pharmacist phone directory can now be found on [email or web address] ([email or web address]).  Listed under [place].

## 2019-06-07 ENCOUNTER — Encounter (HOSPITAL_COMMUNITY)
Admission: EM | Disposition: A | Discharge status: Home / Self Care | Payer: Self-pay | Source: Home / Self Care | Attending: Internal Medicine

## 2019-06-07 DIAGNOSIS — I2510 Atherosclerotic heart disease of native coronary artery without angina pectoris: Secondary | ICD-10-CM

## 2019-06-07 DIAGNOSIS — R55 Syncope and collapse: Secondary | ICD-10-CM

## 2019-06-07 DIAGNOSIS — E785 Hyperlipidemia, unspecified: Secondary | ICD-10-CM

## 2019-06-07 HISTORY — PX: CORONARY STENT INTERVENTION: CATH118234

## 2019-06-07 HISTORY — PX: LEFT HEART CATH AND CORONARY ANGIOGRAPHY: CATH118249

## 2019-06-07 LAB — BASIC METABOLIC PANEL
Anion gap: 13 (ref 5–15)
BUN: 25 mg/dL — ABNORMAL HIGH (ref 8–23)
CO2: 20 mmol/L — ABNORMAL LOW (ref 22–32)
Calcium: 8.6 mg/dL — ABNORMAL LOW (ref 8.9–10.3)
Chloride: 99 mmol/L (ref 98–111)
Creatinine, Ser: 0.72 mg/dL (ref 0.44–1.00)
GFR calc Af Amer: >60 mL/min (ref >60)
GFR calc non Af Amer: >60 mL/min (ref >60)
Glucose, Bld: 108 mg/dL — ABNORMAL HIGH (ref 70–99)
Potassium: 3.7 mmol/L (ref 3.5–5.1)
Sodium: 132 mmol/L — ABNORMAL LOW (ref 135–145)

## 2019-06-07 LAB — CBC
HCT: 27.8 % — ABNORMAL LOW (ref 36.0–46.0)
Hemoglobin: 9.6 g/dL — ABNORMAL LOW (ref 12.0–15.0)
MCH: 33.2 pg (ref 26.0–34.0)
MCHC: 34.5 g/dL (ref 30.0–36.0)
MCV: 96.2 fL (ref 80.0–100.0)
Platelets: 148 10*3/uL — ABNORMAL LOW (ref 150–400)
RBC: 2.89 MIL/uL — ABNORMAL LOW (ref 3.87–5.11)
RDW: 12.7 % (ref 11.5–15.5)
WBC: 7.1 10*3/uL (ref 4.0–10.5)
nRBC: 0 % (ref 0.0–0.2)

## 2019-06-07 LAB — GLUCOSE, CAPILLARY
Glucose-Capillary: 113 mg/dL — ABNORMAL HIGH (ref 70–99)
Glucose-Capillary: 137 mg/dL — ABNORMAL HIGH (ref 70–99)
Glucose-Capillary: 107 mg/dL — ABNORMAL HIGH (ref 70–99)
Glucose-Capillary: 100 mg/dL — ABNORMAL HIGH (ref 70–99)
Glucose-Capillary: 89 mg/dL (ref 70–99)
Glucose-Capillary: 101 mg/dL — ABNORMAL HIGH (ref 70–99)

## 2019-06-07 LAB — POCT ACTIVATED CLOTTING TIME
Activated Clotting Time: 246 seconds
Activated Clotting Time: 252 seconds

## 2019-06-07 LAB — HEPARIN LEVEL (UNFRACTIONATED): Heparin Unfractionated: 0.77 IU/mL — ABNORMAL HIGH (ref 0.30–0.70)

## 2019-06-07 SURGERY — LEFT HEART CATH AND CORONARY ANGIOGRAPHY
Anesthesia: LOCAL

## 2019-06-07 MED ORDER — FENTANYL CITRATE (PF) 100 MCG/2ML IJ SOLN
INTRAMUSCULAR | Status: DC | PRN
  Administered 2019-06-07 (×2): 25 ug via INTRAVENOUS

## 2019-06-07 MED ORDER — HEPARIN (PORCINE) IN NACL 1000-0.9 UT/500ML-% IV SOLN
INTRAVENOUS | Status: DC | PRN
  Administered 2019-06-07 (×2): 500 mL

## 2019-06-07 MED ORDER — IOHEXOL 350 MG/ML SOLN
INTRAVENOUS | Status: DC | PRN
  Administered 2019-06-07: 90 mL

## 2019-06-07 MED ORDER — SODIUM CHLORIDE 0.9 % WEIGHT BASED INFUSION
1.0000 mL/kg/h | INTRAVENOUS | Status: DC
  Administered 2019-06-07: 1 mL/kg/h via INTRAVENOUS

## 2019-06-07 MED ORDER — NITROGLYCERIN 1 MG/10 ML FOR IR/CATH LAB
INTRA_ARTERIAL | Status: DC | PRN
  Administered 2019-06-07 (×2): 150 ug via INTRACORONARY

## 2019-06-07 MED ORDER — SODIUM CHLORIDE 0.9 % WEIGHT BASED INFUSION: 3.0000 mL/kg/h | INTRAVENOUS | Status: DC

## 2019-06-07 MED ORDER — CLOPIDOGREL BISULFATE 300 MG PO TABS
ORAL_TABLET | ORAL | Status: AC
  Filled 2019-06-07: qty 1

## 2019-06-07 MED ORDER — MIDAZOLAM HCL 2 MG/2ML IJ SOLN
INTRAMUSCULAR | Status: AC
  Filled 2019-06-07: qty 2

## 2019-06-07 MED ORDER — HYDRALAZINE HCL 20 MG/ML IJ SOLN: 10.0000 mg | INTRAMUSCULAR | Status: AC | PRN

## 2019-06-07 MED ORDER — SODIUM CHLORIDE 0.9 % IV SOLN: 250.0000 mL | INTRAVENOUS | Status: DC | PRN

## 2019-06-07 MED ORDER — FENTANYL CITRATE (PF) 100 MCG/2ML IJ SOLN
INTRAMUSCULAR | Status: AC
  Filled 2019-06-07: qty 2

## 2019-06-07 MED ORDER — VERAPAMIL HCL 2.5 MG/ML IV SOLN
INTRAVENOUS | Status: DC | PRN
  Administered 2019-06-07: 10 mL via INTRA_ARTERIAL

## 2019-06-07 MED ORDER — SODIUM CHLORIDE 0.9 % WEIGHT BASED INFUSION: 1.0000 mL/kg/h | INTRAVENOUS | Status: DC

## 2019-06-07 MED ORDER — SODIUM CHLORIDE 0.9% FLUSH: 3.0000 mL | INTRAVENOUS | Status: DC | PRN

## 2019-06-07 MED ORDER — VERAPAMIL HCL 2.5 MG/ML IV SOLN
INTRAVENOUS | Status: AC
  Filled 2019-06-07: qty 2

## 2019-06-07 MED ORDER — SODIUM CHLORIDE 0.9% FLUSH: 10.0000 mL | INTRAVENOUS | Status: DC | PRN

## 2019-06-07 MED ORDER — SODIUM CHLORIDE 0.9 % WEIGHT BASED INFUSION: 3.0000 mL/kg/h | INTRAVENOUS | Status: AC

## 2019-06-07 MED ORDER — SODIUM CHLORIDE 0.9% FLUSH
3.0000 mL | Freq: Two times a day (BID) | INTRAVENOUS | Status: DC
  Administered 2019-06-07: 3 mL via INTRAVENOUS

## 2019-06-07 MED ORDER — SODIUM CHLORIDE 0.9% FLUSH
3.0000 mL | Freq: Two times a day (BID) | INTRAVENOUS | Status: DC
  Administered 2019-06-08: 3 mL via INTRAVENOUS

## 2019-06-07 MED ORDER — HEPARIN SODIUM (PORCINE) 1000 UNIT/ML IJ SOLN
INTRAMUSCULAR | Status: AC
  Filled 2019-06-07: qty 1

## 2019-06-07 MED ORDER — MIDAZOLAM HCL 2 MG/2ML IJ SOLN
INTRAMUSCULAR | Status: DC | PRN
  Administered 2019-06-07 (×2): 1 mg via INTRAVENOUS

## 2019-06-07 MED ORDER — LIDOCAINE HCL (PF) 1 % IJ SOLN
INTRAMUSCULAR | Status: AC
  Filled 2019-06-07: qty 30

## 2019-06-07 MED ORDER — SODIUM CHLORIDE 0.9 % WEIGHT BASED INFUSION: 1.0000 mL/kg/h | INTRAVENOUS | Status: AC

## 2019-06-07 MED ORDER — HEPARIN (PORCINE) IN NACL 1000-0.9 UT/500ML-% IV SOLN
INTRAVENOUS | Status: AC
  Filled 2019-06-07: qty 1000

## 2019-06-07 MED ORDER — LIDOCAINE HCL (PF) 1 % IJ SOLN
INTRAMUSCULAR | Status: DC | PRN
  Administered 2019-06-07: 2 mL

## 2019-06-07 MED ORDER — SODIUM CHLORIDE 0.9% FLUSH
10.0000 mL | Freq: Two times a day (BID) | INTRAVENOUS | Status: DC
  Administered 2019-06-08: 10 mL

## 2019-06-07 MED ORDER — NITROGLYCERIN 1 MG/10 ML FOR IR/CATH LAB
INTRA_ARTERIAL | Status: AC
  Filled 2019-06-07: qty 10

## 2019-06-07 MED ORDER — HEPARIN SODIUM (PORCINE) 1000 UNIT/ML IJ SOLN
INTRAMUSCULAR | Status: DC | PRN
  Administered 2019-06-07: 4000 [IU] via INTRAVENOUS
  Administered 2019-06-07 (×2): 2000 [IU] via INTRAVENOUS

## 2019-06-07 MED ORDER — CLOPIDOGREL BISULFATE 300 MG PO TABS
ORAL_TABLET | ORAL | Status: DC | PRN
  Administered 2019-06-07: 300 mg via ORAL

## 2019-06-07 MED ORDER — LABETALOL HCL 5 MG/ML IV SOLN: 10.0000 mg | INTRAVENOUS | Status: AC | PRN

## 2019-06-07 SURGICAL SUPPLY — 20 items
BALLN EMERGE MR 2.0X12 (BALLOONS) ×2
BALLN SAPPHIRE ~~LOC~~ 2.25X15 (BALLOONS) ×2 IMPLANT
BALLN SAPPHIRE ~~LOC~~ 2.5X12 (BALLOONS) ×2 IMPLANT
BALLOON EMERGE MR 2.0X12 (BALLOONS) ×1 IMPLANT
CATH 5FR JL3.5 JR4 ANG PIG MP (CATHETERS) ×2 IMPLANT
CATH LAUNCHER 5F JR4 (CATHETERS) ×2 IMPLANT
DEVICE RAD TR BAND REGULAR (VASCULAR PRODUCTS) ×2 IMPLANT
GLIDESHEATH SLEND SS 6F .021 (SHEATH) ×2 IMPLANT
GUIDEWIRE ANGLED .035X150CM (WIRE) ×2 IMPLANT
GUIDEWIRE INQWIRE 1.5J.035X260 (WIRE) ×1 IMPLANT
INQWIRE 1.5J .035X260CM (WIRE) ×2
KIT ENCORE 26 ADVANTAGE (KITS) ×2 IMPLANT
KIT ESSENTIALS PG (KITS) ×2 IMPLANT
KIT HEART LEFT (KITS) ×2 IMPLANT
PACK CARDIAC CATHETERIZATION (CUSTOM PROCEDURE TRAY) ×2 IMPLANT
STENT RESOLUTE ONYX 2.25X18 (Permanent Stent) ×2 IMPLANT
TRANSDUCER W/STOPCOCK (MISCELLANEOUS) ×2 IMPLANT
TUBING CIL FLEX 10 FLL-RA (TUBING) ×2 IMPLANT
WIRE COUGAR XT STRL 190CM (WIRE) ×2 IMPLANT
WIRE HI TORQ VERSACORE-J 145CM (WIRE) ×2 IMPLANT

## 2019-06-07 NOTE — H&P (View-Only) (Signed)
Progress Note  Patient Name: Daisy Becker Date of Encounter: 06/07/2019  Primary Cardiologist: Skeet Latch, MD   Subjective   Patient denies recurrent chest pain. She had a witnessed syncopal episode yesterday in the setting of hypotension. Md had a long discussion about cardiac cath with patient and family.   Inpatient Medications    Scheduled Meds: . aspirin EC  81 mg Oral Daily  . carvedilol  6.25 mg Oral BID WC  . clopidogrel  75 mg Oral Daily  . rosuvastatin  20 mg Oral q1800  . sodium chloride flush  3 mL Intravenous Once  . sodium chloride flush  3 mL Intravenous Q12H   Continuous Infusions: . sodium chloride    . heparin 700 Units/hr (06/07/19 0610)   PRN Meds: sodium chloride, acetaminophen, nitroGLYCERIN, ondansetron (ZOFRAN) IV, sodium chloride flush   Vital Signs    Vitals:   06/06/19 2156 06/06/19 2158 06/07/19 0610 06/07/19 0613  BP:   127/68   Pulse:    77  Resp:      Temp: 98.5 F (36.9 C)   99.3 F (37.4 C)  TempSrc: Oral   Oral  SpO2:  99%  97%  Weight:   67.9 kg   Height:        Intake/Output Summary (Last 24 hours) at 06/07/2019 0802 Last data filed at 06/07/2019 0300 Gross per 24 hour  Intake 547 ml  Output -  Net 547 ml   Last 3 Weights 06/07/2019 06/06/2019 06/04/2019  Weight (lbs) 149 lb 11.2 oz 148 lb 12.8 oz 150 lb  Weight (kg) 67.903 kg 67.495 kg 68.04 kg      Telemetry    NSR, HR 60-70s; event of first degree AV block (PRI 0.22) - Personally Reviewed  ECG    NO new - Personally Reviewed  Physical Exam   GEN: No acute distress.   Neck: No JVD Cardiac: RRR, no murmurs, rubs, or gallops.  Respiratory: Clear to auscultation bilaterally. GI: Soft, nontender, non-distended  MS: No edema; No deformity. Neuro:  Nonfocal  Psych: Normal affect   Labs    High Sensitivity Troponin:   Recent Labs  Lab 06/04/19 2212 06/05/19 0055 06/05/19 1000  TROPONINIHS 16 78* 1,396*      Chemistry Recent Labs  Lab  06/04/19 2212 06/06/19 0358 06/07/19 0437  NA 132* 132* 132*  K 3.9 3.7 3.7  CL 96* 96* 99  CO2 24 24 20*  GLUCOSE 168* 109* 108*  BUN 18 22 25*  CREATININE 0.90 0.92 0.72  CALCIUM 9.4 8.6* 8.6*  GFRNONAA 56* 54* >60  GFRAA >60 >60 >60  ANIONGAP 12 12 13      Hematology Recent Labs  Lab 06/04/19 2212 06/06/19 0358 06/07/19 0437  WBC 6.3 7.3 7.1  RBC 3.70* 3.17* 2.89*  HGB 12.4 10.4* 9.6*  HCT 36.2 30.6* 27.8*  MCV 97.8 96.5 96.2  MCH 33.5 32.8 33.2  MCHC 34.3 34.0 34.5  RDW 12.6 12.6 12.7  PLT 200 164 148*    BNPNo results for input(s): BNP, PROBNP in the last 168 hours.   DDimer No results for input(s): DDIMER in the last 168 hours.   Radiology    No results found.  Cardiac Studies   Echo 06/05/19 1. Left ventricular ejection fraction, by visual estimation, is 60 to 65%. The left ventricle has normal function. There is no left ventricular hypertrophy. 2. Left ventricular diastolic parameters are consistent with Grade I diastolic dysfunction (impaired relaxation). 3. Global right ventricle has  normal systolic function.The right ventricular size is normal. No increase in right ventricular wall thickness. 4. Left atrial size was normal. 5. Right atrial size was normal. 6. The mitral valve is normal in structure. Trace mitral valve regurgitation. No evidence of mitral stenosis. 7. The tricuspid valve is normal in structure. Tricuspid valve regurgitation is mild. 8. The aortic valve is tricuspid. Aortic valve regurgitation is trivial. Mild aortic valve sclerosis without stenosis. 9. The pulmonic valve was normal in structure. Pulmonic valve regurgitation is not visualized. 10. Normal pulmonary artery systolic pressure. 11. The inferior vena cava is dilated in size with >50% respiratory variability, suggesting right atrial pressure of 8 mmHg.   Patient Profile     85 y.o. female with history of HTN who was admitted for NSTEMI  Assessment & Plan     NSTEMI Patient was admitted with intermittent chest pressure at rest. Troponin peak at 1396. Echo showed normal EF without wall motion abnormalities. Patient preferred medical management over cardiac cath - Heparin was started with plans to continue for 48 hours - Her last episode of chest pressure was 28 hours ago - Continue Aspirin and Plavix  - lovastatin changed to rosuvastatin  - Imdur 30 mg started and then stopped 2/2 presyncope and syncope. - Continue BB>> decreased for hypotension - Md to discuss possible cath with patient and family  HTN - Losartan/HCTZ held - metoprolol switched to carvedilol  - Imdur held as above - Pressures stable  HLD - continue rosuvastatin - LDL 86, goal <70  Syncope - witness ed episode likely form hypotension - Imdur stopped and BB decreased  For questions or updates, please contact [facility] Please consult [email or web address] for contact info under        Signed, Cadence David Stall, PA-C  06/07/2019, 8:02 AM   ---------------------------------------------------------------------------------------------   History and all data above reviewed.  Patient examined.  I agree with the findings as above.  Harla Mensch is feeling well today and has not had another episode of hypotension since yesterday evening.  I spoke at length with her daughter Bonita Quin and her grandson Kandis Mannan (he was on the phone) about risk benefits and alternatives to cardiac catheterization in the setting of NSTEMI.  Constitutional: No acute distress Eyes: pupils equally round and reactive to light, sclera non-icteric, normal conjunctiva and lids ENMT: normal dentition, moist mucous membranes Cardiovascular: regular rhythm, normal rate, no murmurs. S1 and S2 normal. Radial pulses normal bilaterally. No jugular venous distention.  Respiratory: clear to auscultation bilaterally GI : normal bowel sounds, soft and nontender. No distention.   MSK: extremities warm, well perfused.  No edema.  NEURO: grossly nonfocal exam, moves all extremities. PSYCH: alert and oriented x 3, normal mood and affect.   All available labs, radiology testing, previous records reviewed. Agree with documented assessment and plan of my colleague as stated above with the following additions or changes:  Principal Problem:   NSTEMI (non-ST elevated myocardial infarction) [facility]) Active Problems:   Pure hypercholesterolemia    Plan: The patient, her daughter Bonita Quin, and her grandson Kandis Mannan would like to proceed with coronary angiography and understands the risks, benefits, and alternatives to catheterization plus minus PCI.  She has a history of vasovagal syncope and had one episode yesterday and an episode of presyncope 2 days ago.  She tells me she has never fallen and always has a prodrome.  I have discussed in detail the risks of falling while on dual antiplatelet therapy particularly at the age of 7.  The family and I participated in shared decision making.  They do not feel that she is a significant fall risk.  They would like to proceed with the possibility of PCI understanding that dual antiplatelet therapy will need to be uninterrupted for 1 year.  She is very independent at home, does not use a gait aid.  It may be reasonable to have physical therapy/Occupational Therapy evaluate her prior to home-going to ensure she has all the necessary resources.  I am holding her Imdur in the setting of hypotension.  She does have hypertension at times.  We will need to carefully reevaluate her use of losartan HCTZ as well as the transition to carvedilol from metoprolol with these episodes of vasovagal presyncope and syncope.  She may do better with a slightly higher blood pressure goal as an outpatient.  INFORMED CONSENT:  I have reviewed the risks, indications, and alternatives to cardiac catheterization, possible angioplasty, and stenting with the patient. Risks include but are not limited to bleeding,  infection, vascular injury, stroke, myocardial infection, arrhythmia, kidney injury, radiation-related injury in the case of prolonged fluoroscopy use, emergency cardiac surgery, and death. The patient understands the risks of serious complication is 1-2 in 1000 with diagnostic cardiac cath and 1-2% or less with angioplasty/stenting.    Time Spent Directly with Patient:  I have spent a total of 45 minutes with the patient reviewing hospital notes, telemetry, EKGs, labs and examining the patient as well as establishing an assessment and plan that was discussed personally with the patient.  > 50% of time was spent in direct patient care.  Length of Stay:  LOS: 2 days   Jesica Goheen A Jourdain Guay, MD [facility] 12:58 PM  06/07/2019   

## 2019-06-07 NOTE — Interval H&P Note (Signed)
Cath Lab Visit (complete for each Cath Lab visit)  Clinical Evaluation Leading to the Procedure:   ACS: Yes.    Non-ACS:    Anginal Classification: CCS IV  Anti-ischemic medical therapy: Minimal Therapy (1 class of medications)  Non-Invasive Test Results: No non-invasive testing performed  Prior CABG: No previous CABG      History and Physical Interval Note:  06/07/2019 2:33 PM  Daisy Becker  has presented today for surgery, with the diagnosis of Nonstmei.  The various methods of treatment have been discussed with the patient and family. After consideration of risks, benefits and other options for treatment, the patient has consented to  Procedure(s): LEFT HEART CATH AND CORONARY ANGIOGRAPHY (N/A) as a surgical intervention.  The patient's history has been reviewed, patient examined, no change in status, stable for surgery.  I have reviewed the patient's chart and labs.  Questions were answered to the patient's satisfaction.     Sherren Mocha

## 2019-06-07 NOTE — Progress Notes (Signed)
PROGRESS NOTE  Daisy Becker DGL:[id number] DOB: [birth year] DOA: 06/04/2019 PCP: System, Pcp Not In  HPI/Recap of past 24 hours: HPI from Dr Mora Bellman is a 85 y.o. female with medical history significant for hypertension, now presenting to ED for evaluation of intermittent chest discomfort, progressive dyspnea for the past couple of weeks. In the ED, patient is found to be afebrile, saturating well on room air, and hypertensive to 180 systolic.  EKG features a sinus rhythm and chest x-rays negative for acute cardiopulmonary disease. High-sensitivity troponin was normal at 16, then increased to 78.  ED physician discussed the case with cardiology who recommended medical admission, cardiac monitoring.  COVID-19 test pending   06/06/2019, patient noted to be dizzy/lightheaded last night due to hypotensive episode.  Continues to deny any further chest pain, worsening shortness of breath, abdominal pain, nausea/vomiting, fever/chills.  06/07/2019 -patient was seen and examined, remained stable, on heparin drip denies any chest pain or shortness of breath.  Cardiology following. To be determined yet if pursue with cardiac cath -    Subjective:  the patient was seen and examined this morning, remained stable afebrile normotensive denies any chest pain or shortness of breath. Remained stable on heparin drip Per patient nursing staff no events overnight.     Assessment/Plan: Principal Problem:   NSTEMI (non-ST elevated myocardial infarction) (HCC) Active Problems:   Pure hypercholesterolemia   NSTEMI -The patient was seen and examined, currently chest pain-free, no acute distress On heparin drip -Discussed with cardiology at bedside yet to determine if they are willing to proceed with cardiac cath --will discuss with the patient and daughter  -Troponin 16-->78-->1,396 -LDL 86, A1c 6 - EKG with no acute ST changes  -Echo with EF 60 to 65%, grade 1 diastolic  dysfunction, no regional wall motion abnormality -Cardiology consulted: Recommend cardiac catheterization, but patient refusing for now, wants to try medical management and if continues to have chest pain will require cardiac catheterization  Continue IV Heparin for total of 48 hours Continue aspirin, Plavix, rosuvastatin, Coreg, imdur, NTG prn (cardiology management ) -Continue telemetry monitoring   Hypertension -Remained stable Continue to hold home losartan/hydrochlorthiazide (may discontinue upon discharge) Continue meds as above    Malnutrition Type:      Malnutrition Characteristics:      Nutrition Interventions:       Estimated body mass index is 25.7 kg/m as calculated from the following:   Height as of this encounter: 5\' 4"  (1.626 m).   Weight as of this encounter: 67.9 kg.     Code Status: Full  Family Communication: None at bedside  Disposition Plan: Likely home in next 2 to 3 days, cleared by cardiology   Consultants:  Cardiology  Procedures:  None  Antimicrobials:  None  DVT prophylaxis: IV heparin   Objective: Vitals:   06/07/19 0610 06/07/19 0613 06/07/19 0802 06/07/19 0836  BP: 127/68  (!) 141/66 (!) 141/66  Pulse:  77  77  Resp:      Temp:  99.3 F (37.4 C)    TempSrc:  Oral    SpO2:  97%    Weight: 67.9 kg     Height:        Intake/Output Summary (Last 24 hours) at 06/07/2019 1142 Last data filed at 06/07/2019 0900 Gross per 24 hour  Intake 307 ml  Output -  Net 307 ml   Filed Weights   06/04/19 2208 06/06/19 0627 06/07/19 0610  Weight: 68 kg 67.5 kg 67.9  kg    Exam:  General: NAD   Cardiovascular: S1, S2 present  Respiratory: CTAB  Abdomen: Soft, nontender, nondistended, bowel sounds present  Musculoskeletal: No bilateral pedal edema noted  Skin: Normal  Psychiatry: Normal mood   Data Reviewed: CBC: Recent Labs  Lab 06/04/19 2212 06/06/19 0358 06/07/19 0437  WBC 6.3 7.3 7.1  HGB 12.4 10.4*  9.6*  HCT 36.2 30.6* 27.8*  MCV 97.8 96.5 96.2  PLT 200 164 148*   Basic Metabolic Panel: Recent Labs  Lab 06/04/19 2212 06/06/19 0358 06/07/19 0437  NA 132* 132* 132*  K 3.9 3.7 3.7  CL 96* 96* 99  CO2 24 24 20*  GLUCOSE 168* 109* 108*  BUN 18 22 25*  CREATININE 0.90 0.92 0.72  CALCIUM 9.4 8.6* 8.6*   ZOX:[id number]:HbA1C: Recent Labs    06/06/19 0358  HGBA1C 6.0*   CBG: Recent Labs  Lab 06/06/19 0751 06/06/19 1625 06/06/19 1804 06/06/19 2154 06/07/19 0800  GLUCAP 101* 113* 137* 137* 107*   Lipid Profile: Recent Labs    06/06/19 0358  CHOL 161  HDL 68  LDLCALC 86  TRIG 37  CHOLHDL 2.4  Urine analysis:    Component Value Date/Time   COLORURINE YELLOW 02/13/2014 1159   APPEARANCEUR CLEAR 02/13/2014 1159   LABSPEC 1.008 02/13/2014 1159   PHURINE 7.0 02/13/2014 1159   GLUCOSEU NEGATIVE 02/13/2014 1159   HGBUR TRACE (A) 02/13/2014 1159   BILIRUBINUR NEGATIVE 02/13/2014 1159   KETONESUR NEGATIVE 02/13/2014 1159   PROTEINUR NEGATIVE 02/13/2014 1159   UROBILINOGEN 0.2 02/13/2014 1159   NITRITE NEGATIVE 02/13/2014 1159   LEUKOCYTESUR SMALL (A) 02/13/2014 1159   Sepsis Labs: @LABRCNTIP (procalcitonin:4,lacticidven:4)  ) Recent Results (from the past 240 hour(s))  SARS CORONAVIRUS 2 (TAT 6-24 HRS) Nasopharyngeal Nasopharyngeal Swab     Status: None   Collection Time: 06/05/19  5:11 AM   Specimen: Nasopharyngeal Swab  Result Value Ref Range Status   SARS Coronavirus 2 NEGATIVE NEGATIVE Final    Comment: (NOTE) SARS-CoV-2 target nucleic acids are NOT DETECTED. The SARS-CoV-2 RNA is generally detectable in upper and lower respiratory specimens during the acute phase of infection. Negative results do not preclude SARS-CoV-2 infection, do not rule out co-infections with other pathogens, and should not be used as the sole basis for treatment or other patient management decisions. Negative results must be combined with clinical observations, patient history, and  epidemiological information. The expected result is Negative. Fact Sheet for Patients: HairSlick.nohttps://www.fda.gov/media/138098/download Fact Sheet for Healthcare Providers: quierodirigir.comhttps://www.fda.gov/media/138095/download This test is not yet approved or cleared by the [place] FDA and  has been authorized for detection and/or diagnosis of SARS-CoV-2 by FDA under an Emergency Use Authorization (EUA). This EUA will remain  in effect (meaning this test can be used) for the duration of the COVID-19 declaration under Section 56 4(b)(1) of the Act, 21 U.S.C. section 360bbb-3(b)(1), unless the authorization is terminated or revoked sooner. Performed at [facility], [address] [place], [place] [address]       Studies: No results found.  Scheduled Meds: . aspirin EC  81 mg Oral Daily  . carvedilol  6.25 mg Oral BID WC  . clopidogrel  75 mg Oral Daily  . rosuvastatin  20 mg Oral q1800  . sodium chloride flush  3 mL Intravenous Once  . sodium chloride flush  3 mL Intravenous Q12H    Continuous Infusions: . sodium chloride    . heparin 700 Units/hr (06/07/19 0610)  LOS: 2 days     Deatra James, MD [facility]  If 7PM-7AM, please contact night-coverage [email or web address] 06/07/2019, 11:42 AM

## 2019-06-07 NOTE — Progress Notes (Signed)
ANTICOAGULATION CONSULT NOTE   Pharmacy Consult for Heparin Indication: chest pain/ACS  No Known Allergies  Patient Measurements: Height: 5\' 4"  (162.6 cm) Weight: 148 lb 12.8 oz (67.5 kg) IBW/kg (Calculated) : 54.7 Heparin Dosing Weight: 66.3 kg  Vital Signs: Temp: 98.5 F (36.9 C) (11/30 2156) Temp Source: Oral (11/30 2156) BP: 109/48 (11/30 2134)  Labs: Recent Labs    06/04/19 2212 06/05/19 0055 06/05/19 1000 06/05/19 2303 06/06/19 0358 06/07/19 0437  HGB 12.4  --   --   --  10.4* 9.6*  HCT 36.2  --   --   --  30.6* 27.8*  PLT 200  --   --   --  164 148*  HEPARINUNFRC  --   --   --  0.28* 0.67 0.77*  CREATININE 0.90  --   --   --  0.92 0.72  TROPONINIHS 16 78* 1,396*  --   --   --     Estimated Creatinine Clearance: 42.4 mL/min (by C-G formula based on SCr of 0.72 mg/dL).  Assessment: 63 YOF presents with NSTEMI on heparin. No anticoagulation PTA. Plans are for 48 hours of heparin - should end today ~1500  Heparin level supratherapeutic (0.77) on gtt at 800 units/hr. No bleeding noted.  Goal of Therapy:  Heparin level 0.3-0.7 units/ml Monitor platelets by anticoagulation protocol: Yes   Plan:  -Will decrease heparin to 700 units/hr -Anticipate heparin to d/c 12/1 ~1500 so will not check further level  Sherlon Handing, PharmD, BCPS Please see [email or web address] for complete clinical pharmacist phone list 06/07/2019 5:47 AM

## 2019-06-07 NOTE — Progress Notes (Addendum)
Received pt with small hematoma above right wrist TR band.  Administered pressure for 5 minutes and resolved.  Later noted small hematoma < 3 cm again, and held pressure.  Constantly reminding pt not to move her right hand or wrist.   Idolina Primer, RN

## 2019-06-07 NOTE — Progress Notes (Addendum)
Progress Note  Patient Name: Daisy Becker Date of Encounter: 06/07/2019  Primary Cardiologist: Skeet Latch, MD   Subjective   Patient denies recurrent chest pain. She had a witnessed syncopal episode yesterday in the setting of hypotension. Md had a long discussion about cardiac cath with patient and family.   Inpatient Medications    Scheduled Meds: . aspirin EC  81 mg Oral Daily  . carvedilol  6.25 mg Oral BID WC  . clopidogrel  75 mg Oral Daily  . rosuvastatin  20 mg Oral q1800  . sodium chloride flush  3 mL Intravenous Once  . sodium chloride flush  3 mL Intravenous Q12H   Continuous Infusions: . sodium chloride    . heparin 700 Units/hr (06/07/19 0610)   PRN Meds: sodium chloride, acetaminophen, nitroGLYCERIN, ondansetron (ZOFRAN) IV, sodium chloride flush   Vital Signs    Vitals:   06/06/19 2156 06/06/19 2158 06/07/19 0610 06/07/19 0613  BP:   127/68   Pulse:    77  Resp:      Temp: 98.5 F (36.9 C)   99.3 F (37.4 C)  TempSrc: Oral   Oral  SpO2:  99%  97%  Weight:   67.9 kg   Height:        Intake/Output Summary (Last 24 hours) at 06/07/2019 0802 Last data filed at 06/07/2019 0300 Gross per 24 hour  Intake 547 ml  Output -  Net 547 ml   Last 3 Weights 06/07/2019 06/06/2019 06/04/2019  Weight (lbs) 149 lb 11.2 oz 148 lb 12.8 oz 150 lb  Weight (kg) 67.903 kg 67.495 kg 68.04 kg      Telemetry    NSR, HR 60-70s; event of first degree AV block (PRI 0.22) - Personally Reviewed  ECG    NO new - Personally Reviewed  Physical Exam   GEN: No acute distress.   Neck: No JVD Cardiac: RRR, no murmurs, rubs, or gallops.  Respiratory: Clear to auscultation bilaterally. GI: Soft, nontender, non-distended  MS: No edema; No deformity. Neuro:  Nonfocal  Psych: Normal affect   Labs    High Sensitivity Troponin:   Recent Labs  Lab 06/04/19 2212 06/05/19 0055 06/05/19 1000  TROPONINIHS 16 78* 1,396*      Chemistry Recent Labs  Lab  06/04/19 2212 06/06/19 0358 06/07/19 0437  NA 132* 132* 132*  K 3.9 3.7 3.7  CL 96* 96* 99  CO2 24 24 20*  GLUCOSE 168* 109* 108*  BUN 18 22 25*  CREATININE 0.90 0.92 0.72  CALCIUM 9.4 8.6* 8.6*  GFRNONAA 56* 54* >60  GFRAA >60 >60 >60  ANIONGAP 12 12 13      Hematology Recent Labs  Lab 06/04/19 2212 06/06/19 0358 06/07/19 0437  WBC 6.3 7.3 7.1  RBC 3.70* 3.17* 2.89*  HGB 12.4 10.4* 9.6*  HCT 36.2 30.6* 27.8*  MCV 97.8 96.5 96.2  MCH 33.5 32.8 33.2  MCHC 34.3 34.0 34.5  RDW 12.6 12.6 12.7  PLT 200 164 148*    BNPNo results for input(s): BNP, PROBNP in the last 168 hours.   DDimer No results for input(s): DDIMER in the last 168 hours.   Radiology    No results found.  Cardiac Studies   Echo 06/05/19 1. Left ventricular ejection fraction, by visual estimation, is 60 to 65%. The left ventricle has normal function. There is no left ventricular hypertrophy. 2. Left ventricular diastolic parameters are consistent with Grade I diastolic dysfunction (impaired relaxation). 3. Global right ventricle has  normal systolic function.The right ventricular size is normal. No increase in right ventricular wall thickness. 4. Left atrial size was normal. 5. Right atrial size was normal. 6. The mitral valve is normal in structure. Trace mitral valve regurgitation. No evidence of mitral stenosis. 7. The tricuspid valve is normal in structure. Tricuspid valve regurgitation is mild. 8. The aortic valve is tricuspid. Aortic valve regurgitation is trivial. Mild aortic valve sclerosis without stenosis. 9. The pulmonic valve was normal in structure. Pulmonic valve regurgitation is not visualized. 10. Normal pulmonary artery systolic pressure. 11. The inferior vena cava is dilated in size with >50% respiratory variability, suggesting right atrial pressure of 8 mmHg.   Patient Profile     85 y.o. female with history of HTN who was admitted for NSTEMI  Assessment & Plan     NSTEMI Patient was admitted with intermittent chest pressure at rest. Troponin peak at 1396. Echo showed normal EF without wall motion abnormalities. Patient preferred medical management over cardiac cath - Heparin was started with plans to continue for 48 hours - Her last episode of chest pressure was 28 hours ago - Continue Aspirin and Plavix  - lovastatin changed to rosuvastatin  - Imdur 30 mg started and then stopped 2/2 presyncope and syncope. - Continue BB>> decreased for hypotension - Md to discuss possible cath with patient and family  HTN - Losartan/HCTZ held - metoprolol switched to carvedilol  - Imdur held as above - Pressures stable  HLD - continue rosuvastatin - LDL 86, goal <70  Syncope - witness ed episode likely form hypotension - Imdur stopped and BB decreased  For questions or updates, please contact [facility] Please consult [email or web address] for contact info under        Signed, Cadence David Stall, PA-C  06/07/2019, 8:02 AM   ---------------------------------------------------------------------------------------------   History and all data above reviewed.  Patient examined.  I agree with the findings as above.  Daisy Becker is feeling well today and has not had another episode of hypotension since yesterday evening.  I spoke at length with her daughter Daisy Becker and her grandson Daisy Becker (he was on the phone) about risk benefits and alternatives to cardiac catheterization in the setting of NSTEMI.  Constitutional: No acute distress Eyes: pupils equally round and reactive to light, sclera non-icteric, normal conjunctiva and lids ENMT: normal dentition, moist mucous membranes Cardiovascular: regular rhythm, normal rate, no murmurs. S1 and S2 normal. Radial pulses normal bilaterally. No jugular venous distention.  Respiratory: clear to auscultation bilaterally GI : normal bowel sounds, soft and nontender. No distention.   MSK: extremities warm, well perfused.  No edema.  NEURO: grossly nonfocal exam, moves all extremities. PSYCH: alert and oriented x 3, normal mood and affect.   All available labs, radiology testing, previous records reviewed. Agree with documented assessment and plan of my colleague as stated above with the following additions or changes:  Principal Problem:   NSTEMI (non-ST elevated myocardial infarction) [facility]) Active Problems:   Pure hypercholesterolemia    Plan: The patient, her daughter Daisy Becker, and her grandson Daisy Becker would like to proceed with coronary angiography and understands the risks, benefits, and alternatives to catheterization plus minus PCI.  She has a history of vasovagal syncope and had one episode yesterday and an episode of presyncope 2 days ago.  She tells me she has never fallen and always has a prodrome.  I have discussed in detail the risks of falling while on dual antiplatelet therapy particularly at the age of 7.  The family and I participated in shared decision making.  They do not feel that she is a significant fall risk.  They would like to proceed with the possibility of PCI understanding that dual antiplatelet therapy will need to be uninterrupted for 1 year.  She is very independent at home, does not use a gait aid.  It may be reasonable to have physical therapy/Occupational Therapy evaluate her prior to home-going to ensure she has all the necessary resources.  I am holding her Imdur in the setting of hypotension.  She does have hypertension at times.  We will need to carefully reevaluate her use of losartan HCTZ as well as the transition to carvedilol from metoprolol with these episodes of vasovagal presyncope and syncope.  She may do better with a slightly higher blood pressure goal as an outpatient.  INFORMED CONSENT:  I have reviewed the risks, indications, and alternatives to cardiac catheterization, possible angioplasty, and stenting with the patient. Risks include but are not limited to bleeding,  infection, vascular injury, stroke, myocardial infection, arrhythmia, kidney injury, radiation-related injury in the case of prolonged fluoroscopy use, emergency cardiac surgery, and death. The patient understands the risks of serious complication is 1-2 in 1000 with diagnostic cardiac cath and 1-2% or less with angioplasty/stenting.    Time Spent Directly with Patient:  I have spent a total of 45 minutes with the patient reviewing hospital notes, telemetry, EKGs, labs and examining the patient as well as establishing an assessment and plan that was discussed personally with the patient.  > 50% of time was spent in direct patient care.  Length of Stay:  LOS: 2 days   Parke PoissonGayatri A Acharya, MD [facility] 12:58 PM  06/07/2019

## 2019-06-08 ENCOUNTER — Telehealth: Payer: Self-pay | Admitting: Internal Medicine

## 2019-06-08 ENCOUNTER — Telehealth: Payer: Self-pay | Admitting: Adult Health

## 2019-06-08 ENCOUNTER — Encounter (HOSPITAL_COMMUNITY): Payer: Self-pay | Admitting: Cardiovascular Disease

## 2019-06-08 LAB — BASIC METABOLIC PANEL
Anion gap: 11 (ref 5–15)
BUN: 19 mg/dL (ref 8–23)
CO2: 19 mmol/L — ABNORMAL LOW (ref 22–32)
Calcium: 8.3 mg/dL — ABNORMAL LOW (ref 8.9–10.3)
Chloride: 101 mmol/L (ref 98–111)
Creatinine, Ser: 0.7 mg/dL (ref 0.44–1.00)
GFR calc Af Amer: >60 mL/min (ref >60)
GFR calc non Af Amer: >60 mL/min (ref >60)
Glucose, Bld: 90 mg/dL (ref 70–99)
Potassium: 3.4 mmol/L — ABNORMAL LOW (ref 3.5–5.1)
Sodium: 131 mmol/L — ABNORMAL LOW (ref 135–145)

## 2019-06-08 LAB — CBC
HCT: 29.8 % — ABNORMAL LOW (ref 36.0–46.0)
Hemoglobin: 10.3 g/dL — ABNORMAL LOW (ref 12.0–15.0)
MCH: 33.3 pg (ref 26.0–34.0)
MCHC: 34.6 g/dL (ref 30.0–36.0)
MCV: 96.4 fL (ref 80.0–100.0)
Platelets: 149 10*3/uL — ABNORMAL LOW (ref 150–400)
RBC: 3.09 MIL/uL — ABNORMAL LOW (ref 3.87–5.11)
RDW: 12.7 % (ref 11.5–15.5)
WBC: 10.5 10*3/uL (ref 4.0–10.5)
nRBC: 0 % (ref 0.0–0.2)

## 2019-06-08 LAB — GLUCOSE, CAPILLARY
Glucose-Capillary: 96 mg/dL (ref 70–99)
Glucose-Capillary: 172 mg/dL — ABNORMAL HIGH (ref 70–99)

## 2019-06-08 MED ORDER — NITROGLYCERIN 0.4 MG SL SUBL: 0.4000 mg | SUBLINGUAL_TABLET | SUBLINGUAL | 12 refills | Status: DC | PRN

## 2019-06-08 MED ORDER — CLOPIDOGREL BISULFATE 75 MG PO TABS: 75.0000 mg | ORAL_TABLET | Freq: Every day | ORAL | 3 refills | Status: AC

## 2019-06-08 MED ORDER — ROSUVASTATIN CALCIUM 20 MG PO TABS: 20.0000 mg | ORAL_TABLET | Freq: Every day | ORAL | 1 refills | Status: DC

## 2019-06-08 MED ORDER — ASPIRIN 81 MG PO TBEC: 81.0000 mg | DELAYED_RELEASE_TABLET | Freq: Every day | ORAL | 0 refills | Status: AC

## 2019-06-08 MED ORDER — CARVEDILOL 6.25 MG PO TABS: 6.2500 mg | ORAL_TABLET | Freq: Two times a day (BID) | ORAL | 1 refills | Status: DC

## 2019-06-08 NOTE — Telephone Encounter (Signed)
See other phone note ./cy 

## 2019-06-08 NOTE — Telephone Encounter (Signed)
New Message  Per Cadence, set up TOC visit on 06/16/19 at 4:20 pm with Dr. Margaretann Loveless.

## 2019-06-08 NOTE — Discharge Summary (Signed)
Physician Discharge Summary [facility]    Patient: Daisy FeeMarcella Mckinnon                   Admit date: 06/04/2019   DOB: [birth year]             Discharge date:06/08/2019/9:16 AM [id number]                          PCP: System, Pcp Not In  Disposition:  HOME   Recommendations for Outpatient Follow-up:   . Follow up: in 1 week  Discharge Condition: Stable   Code Status:   Code Status: Full Code  Diet recommendation: Cardiac diet   Discharge Diagnoses:    Principal Problem:   NSTEMI (non-ST elevated myocardial infarction) [facility](HCC) Active Problems:   Pure hypercholesterolemia   History of Present Illness/ Hospital Course Charline Bills/Brief Summary:  HPI from Dr Fonda Kinderpyd Kassandra Cassidyis a 85 y.o.femalewith medical history significant forhypertension, now presenting to ED for evaluation of intermittent chest discomfort, progressive dyspnea for the past couple of weeks. In the ED, patient is found to be afebrile, saturating well on room air, and hypertensive to 180 systolic. EKG features a sinus rhythm and chest x-rays negative for acute cardiopulmonary disease. High-sensitivity troponin was normal at 16, then increasedto 78.ED physician discussed the case with cardiology who recommended medical admission, cardiac monitoring.  COVID-19 test pending   06/06/2019, patient noted to be dizzy/lightheaded last night due to hypotensive episode.  Continues to deny any further chest pain, worsening shortness of breath, abdominal pain, nausea/vomiting, fever/chills.  06/07/2019 - On 06/06/2019 at night, patient had an episode of hypotension, and syncope.  No focal neuro deficits. patient was seen and examined, remained stable, on heparin drip denies any chest pain or shortness of breath.    Cardiac cath 06/07/19 1. 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 Moderate nonobstructive LAD and LCx stenoses 3. Normal LV function by echo   Recommend: DAPT with ASA and clopidogrel x 12 months, OK for DC tomorrow am if no complications arise.   NSTEMI -The patient was seen and examined, currently chest pain-free, no acute distress -Off heparin drip x48 hours.  Status post cardiac cath as above -Discussed with cardiology at bedside-Troponin 16-->78-->1,396 -LDL 86, A1c 6 - EKG with no acute ST changes  -Echo with EF 60 to 65%, grade 1 diastolic dysfunction, no regional wall motion abnormality -Cardiology consulted: Recommend cardiac catheterization, but patient refusing for now, wants to try medical management and if continues to have chest pain will require cardiac catheterization   Continue aspirin, Plavix, rosuvastatin, Coreg, imdur, NTG prn (cardiology management )    Hypertension -Remained stable DC'd home medications of losartan/hydrochlorthiazide (may discontinue upon discharge) Due to hypotension, syncope.   Discharge Instructions:   Discharge Instructions    Activity as tolerated - No restrictions   Complete by: As directed    Call MD for:   Complete by: As directed    Chest pains, shortness of breath   Call MD for:  difficulty breathing, headache or visual disturbances   Complete by: As directed    Call MD for:  persistant dizziness or light-headedness   Complete by: As directed    Call MD for:  redness, tenderness, or signs of infection (pain, swelling, redness, odor or green/yellow discharge around incision site)   Complete by: As directed    Call MD for:  severe uncontrolled pain  Complete by: As directed    Diet - low sodium heart healthy   Complete by: As directed    Discharge instructions   Complete by: As directed    Follow-up with your PCP and cardiologist in 1 to 2 weeks, continue current aspirin and Plavix per recommendation 12 months.   Increase activity slowly   Complete by: As directed        Medication List    STOP taking these medications   losartan-hydrochlorothiazide  100-25 MG tablet Commonly known as: HYZAAR   lovastatin 10 MG tablet Commonly known as: MEVACOR     TAKE these medications   aspirin 81 MG EC tablet Take 1 tablet (81 mg total) by mouth daily. Start taking on: June 09, 2019   carvedilol 6.25 MG tablet Commonly known as: COREG Take 1 tablet (6.25 mg total) by mouth 2 (two) times daily with a meal.   clopidogrel 75 MG tablet Commonly known as: PLAVIX Take 1 tablet (75 mg total) by mouth daily. Start taking on: June 09, 2019   nitroGLYCERIN 0.4 MG SL tablet Commonly known as: NITROSTAT Place 1 tablet (0.4 mg total) under the tongue every 5 (five) minutes as needed for chest pain.   rosuvastatin 20 MG tablet Commonly known as: CRESTOR Take 1 tablet (20 mg total) by mouth daily at 6 PM.       No Known Allergies   Procedures /Studies:   Dg Chest 2 View  Result Date: 06/04/2019 CLINICAL DATA:  Chest pain EXAM: CHEST - 2 VIEW COMPARISON:  02/24/2016 FINDINGS: Heart and mediastinal contours are within normal limits. No focal opacities or effusions. No acute bony abnormality. IMPRESSION: No active cardiopulmonary disease. Electronically Signed   By: Charlett Nose M.D.   On: 06/04/2019 22:36     Subjective:   Patient was seen and examined 06/08/2019, 9:16 AM Patient stable today. No acute distress.  No issues overnight Stable for discharge.  Discharge Exam:    Vitals:   06/07/19 2252 06/08/19 0455 06/08/19 0458 06/08/19 0902  BP:  (!) 153/56  (!) 122/51  Pulse: 76 79 64 79  Resp:      Temp: 98.6 F (37 C) 99.9 F (37.7 C)    TempSrc: Oral Oral    SpO2: 99%  96%   Weight:   68.1 kg   Height:        General: Pt lying comfortably in bed & appears in no obvious distress. Cardiovascular: S1 & S2 heard, RRR, S1/S2 +. No murmurs, rubs, gallops or clicks. No JVD or pedal edema. Respiratory: Clear to auscultation without wheezing, rhonchi or crackles. No increased work of breathing. Abdominal:  Non-distended,  non-tender & soft. No organomegaly or masses appreciated. Normal bowel sounds heard. CNS: Alert and oriented. No focal deficits. Extremities: no edema, no cyanosis    The results of significant diagnostics from this hospitalization (including imaging, microbiology, ancillary and laboratory) are listed below for reference.      Microbiology:   Recent Results (from the past 240 hour(s))  SARS CORONAVIRUS 2 (TAT 6-24 HRS) Nasopharyngeal Nasopharyngeal Swab     Status: None   Collection Time: 06/05/19  5:11 AM   Specimen: Nasopharyngeal Swab  Result Value Ref Range Status   SARS Coronavirus 2 NEGATIVE NEGATIVE Final    Comment: (NOTE) SARS-CoV-2 target nucleic acids are NOT DETECTED. The SARS-CoV-2 RNA is generally detectable in upper and lower respiratory specimens during the acute phase of infection. Negative results do not preclude SARS-CoV-2 infection, do  not rule out co-infections with other pathogens, and should not be used as the sole basis for treatment or other patient management decisions. Negative results must be combined with clinical observations, patient history, and epidemiological information. The expected result is Negative. Fact Sheet for Patients: SugarRoll.be Fact Sheet for Healthcare Providers: https://www.woods-mathews.com/ This test is not yet approved or cleared by the [place] FDA and  has been authorized for detection and/or diagnosis of SARS-CoV-2 by FDA under an Emergency Use Authorization (EUA). This EUA will remain  in effect (meaning this test can be used) for the duration of the COVID-19 declaration under Section 56 4(b)(1) of the Act, 21 U.S.C. section 360bbb-3(b)(1), unless the authorization is terminated or revoked sooner. Performed at [facility], [place] [address], [place], [place] [address]      Labs:   CBC: Recent Labs  Lab 06/04/19 2212 06/06/19 0358 06/07/19 0437 06/08/19 0327   WBC 6.3 7.3 7.1 10.5  HGB 12.4 10.4* 9.6* 10.3*  HCT 36.2 30.6* 27.8* 29.8*  MCV 97.8 96.5 96.2 96.4  PLT 200 164 148* 195*   Basic Metabolic Panel: Recent Labs  Lab 06/04/19 2212 06/06/19 0358 06/07/19 0437 06/08/19 0327  NA 132* 132* 132* 131*  K 3.9 3.7 3.7 3.4*  CL 96* 96* 99 101  CO2 24 24 20* 19*  GLUCOSE 168* 109* 108* 90  BUN 18 22 25* 19  CREATININE 0.90 0.92 0.72 0.70  CALCIUM 9.4 8.6* 8.6* 8.3*   Liver Function Tests: No results for input(s): AST, ALT, ALKPHOS, BILITOT, PROT, ALBUMIN in the last 168 hours. BNP (last 3 results) No results for input(s): BNP in the last 8760 hours. Cardiac Enzymes: No results for input(s): CKTOTAL, CKMB, CKMBINDEX, TROPONINI in the last 168 hours. CBG: Recent Labs  Lab 06/07/19 0800 06/07/19 1141 06/07/19 1645 06/07/19 2250 06/08/19 0807  GLUCAP 107* 100* 89 101* 96   Hgb A1c Recent Labs    06/06/19 0358  HGBA1C 6.0*   Lipid Profile Recent Labs    06/06/19 0358  CHOL 161  HDL 68  LDLCALC 86  TRIG 37  CHOLHDL 2.4   Thyroid function studies No results for input(s): TSH, T4TOTAL, T3FREE, THYROIDAB in the last 72 hours.  Invalid input(s): FREET3 Anemia work up No results for input(s): VITAMINB12, FOLATE, FERRITIN, TIBC, IRON, RETICCTPCT in the last 72 hours. Urinalysis    Component Value Date/Time   COLORURINE YELLOW 02/13/2014 [place] 02/13/2014 1159   LABSPEC 1.008 02/13/2014 1159   PHURINE 7.0 02/13/2014 1159   GLUCOSEU NEGATIVE 02/13/2014 1159   HGBUR TRACE (A) 02/13/2014 1159   BILIRUBINUR NEGATIVE 02/13/2014 1159   KETONESUR NEGATIVE 02/13/2014 1159   PROTEINUR NEGATIVE 02/13/2014 1159   UROBILINOGEN 0.2 02/13/2014 1159   NITRITE NEGATIVE 02/13/2014 1159   LEUKOCYTESUR SMALL (A) 02/13/2014 1159         Time coordinating discharge: Over 55 minutes  SIGNED: Deatra James, MD, FACP, FHM. [facility],  Pager [phone]  If 7PM-7AM, please contact night-coverage  [email or web address] Ota [facility] 06/08/2019, 9:16 AM

## 2019-06-08 NOTE — Telephone Encounter (Signed)
-----   Message from [place], PA-C sent at 06/08/2019 10:38 AM EST ----- Regarding: TOC phone call Good morning,  This patient is being discharged today with dx NSTEMI. She lives in [place] but is unsure where she will be following up. Just in case we made her a [facility] appointment Wednesday December 9 with Bunnie Domino. Can a TOC phone call please be arranged?  Thanks,  Cadence

## 2019-06-08 NOTE — Telephone Encounter (Signed)
Routed to NL triage 

## 2019-06-08 NOTE — Telephone Encounter (Signed)
Patient contacted regarding discharge from [facility] on 06/08/2019.  NO DPR ON FILE. SPOKE WITH PT WHO IS OKAY WITH LINDA KALALA, DAUGHTER, DISCUSSING TOC QUESTIONS WITH TRIAGE NURSE  Patient understands to follow up with provider Cherlynn Kaiser, MD on 06/16/2019 at 4:20 PM at [facility] Address: [address] [place], [place], [place] [address]   Patient understands discharge instructions? YES Patient understands medications and regiment? YES Patient understands to bring all medications to this visit? YES   Postop Surgical Patients:                What is your wound status? Any signs/ symptoms of infection (Temp, redness/ red streaks, swelling, purulent drainage, foul odor or smell)? NO SIGNS/SYMPTOMS OF INFECTION .             Please do not place any creams/ lotions/ or antibiotic ointment on any surgical incisions/ wounds without physician approval. PT AND DAUGHTER AWARE .             Do you have any questions about your medications?  DAUGHTER QUESTIONED IF IT IS OKAY FOR PT TO TAKE CRESTOR AFTER 6 PM BECAUSE SHE IS WORKING ON PICKING UP MEDICATIONS FOR PT   All medications (except pain medications) are to be filled by your Cardiologist AFTER your first post op appointment with them.   PT AND DAUGHTER AWARE Are you taking your pain medication? PATIENT IS NOT TAKING ANY PAIN MEDICATION .             How is your pain controlled?  NA Pain level 1 ON A SCALE FROM 1-10. PT HAS PAIN TO LEFT THIGH, DESCRIBED AS SORENESS; 'HURTS WHEN SHE GETS UP. ADVISED DAUGHTER OF DVT SIGNS/SYMPTOMS TO MONITOR FOR .             If you require a refill on pain medications, know that the same medication/ amount may not be prescribed or a refill may not be given.  Please contact your pharmacy for refill requests.  PT AND DAUGHTER AWARE .             Do you have help at home with ADL's?  If you have home health, have you been contacted or seen by the  agency? NO HOME HEALTH. PT WILL BE ASSISTED BY DAUGHTER AND GRANDCHILDREN FOR ADL'S  .             Please refer to your Pre/post surgery booklet, there is a lot of useful information in it that may answer any questions you may have. PT AND DAUGHTER AWARE  .             Please note that it is ok to remove your surgical dressing, shower (soap/ water), and pat the incision dry. PT AND DAUGHTER AWARE

## 2019-06-08 NOTE — Progress Notes (Addendum)
Progress Note  Patient Name: Daisy Becker Date of Encounter: 06/08/2019  Primary Cardiologist: Chilton Si, MD   Subjective   Patient is feeling well this morning. Denies chest pain or SOB. Cath site, right radial is stable.   Inpatient Medications    Scheduled Meds: . aspirin EC  81 mg Oral Daily  . carvedilol  6.25 mg Oral BID WC  . clopidogrel  75 mg Oral Daily  . rosuvastatin  20 mg Oral q1800  . sodium chloride flush  10-40 mL Intracatheter Q12H  . sodium chloride flush  3 mL Intravenous Once  . sodium chloride flush  3 mL Intravenous Q12H   Continuous Infusions: . sodium chloride    . sodium chloride     PRN Meds: sodium chloride, sodium chloride, acetaminophen, nitroGLYCERIN, ondansetron (ZOFRAN) IV, sodium chloride flush, sodium chloride flush   Vital Signs    Vitals:   06/07/19 1706 06/07/19 2252 06/08/19 0455 06/08/19 0458  BP: (!) 171/63  (!) 153/56   Pulse: 69 76 79 64  Resp:      Temp:  98.6 F (37 C) 99.9 F (37.7 C)   TempSrc:  Oral Oral   SpO2:  99%  96%  Weight:    68.1 kg  Height:        Intake/Output Summary (Last 24 hours) at 06/08/2019 0755 Last data filed at 06/07/2019 1822 Gross per 24 hour  Intake 936.55 ml  Output -  Net 936.55 ml   Last 3 Weights 06/08/2019 06/07/2019 06/06/2019  Weight (lbs) 150 lb 3.2 oz 149 lb 11.2 oz 148 lb 12.8 oz  Weight (kg) 68.13 kg 67.903 kg 67.495 kg      Telemetry    NSR, HR 70-80s; no other arrhythmias noted - Personally Reviewed  ECG    NSR, 76 bpm, nonspecific T wave changes; q waves ant/inferior leads; LAD - Personally Reviewed  Physical Exam   GEN: No acute distress.   Neck: No JVD Cardiac: RRR, no murmurs, rubs, or gallops.  Respiratory: Clear to auscultation bilaterally. GI: Soft, nontender, non-distended  MS: No edema; No deformity; right radial site is clean and dry with minimal tenderness Neuro:  Nonfocal  Psych: Normal affect   Labs    High Sensitivity Troponin:    Recent Labs  Lab 06/04/19 2212 06/05/19 0055 06/05/19 1000  TROPONINIHS 16 78* 1,396*      Chemistry Recent Labs  Lab 06/06/19 0358 06/07/19 0437 06/08/19 0327  NA 132* 132* 131*  K 3.7 3.7 3.4*  CL 96* 99 101  CO2 24 20* 19*  GLUCOSE 109* 108* 90  BUN 22 25* 19  CREATININE 0.92 0.72 0.70  CALCIUM 8.6* 8.6* 8.3*  GFRNONAA 54* >60 >60  GFRAA >60 >60 >60  ANIONGAP 12 13 11      Hematology Recent Labs  Lab 06/06/19 0358 06/07/19 0437 06/08/19 0327  WBC 7.3 7.1 10.5  RBC 3.17* 2.89* 3.09*  HGB 10.4* 9.6* 10.3*  HCT 30.6* 27.8* 29.8*  MCV 96.5 96.2 96.4  MCH 32.8 33.2 33.3  MCHC 34.0 34.5 34.6  RDW 12.6 12.7 12.7  PLT 164 148* 149*    BNPNo results for input(s): BNP, PROBNP in the last 168 hours.   DDimer No results for input(s): DDIMER in the last 168 hours.   Radiology    No results found.  Cardiac Studies   Echo 06/05/19 1. Left ventricular ejection fraction, by visual estimation, is 60 to 65%. The left ventricle has normal function. There is no  left ventricular hypertrophy. 2. Left ventricular diastolic parameters are consistent with Grade I diastolic dysfunction (impaired relaxation). 3. Global right ventricle has normal systolic function.The right ventricular size is normal. No increase in right ventricular wall thickness. 4. Left atrial size was normal. 5. Right atrial size was normal. 6. The mitral valve is normal in structure. Trace mitral valve regurgitation. No evidence of mitral stenosis. 7. The tricuspid valve is normal in structure. Tricuspid valve regurgitation is mild. 8. The aortic valve is tricuspid. Aortic valve regurgitation is trivial. Mild aortic valve sclerosis without stenosis. 9. The pulmonic valve was normal in structure. Pulmonic valve regurgitation is not visualized. 10. Normal pulmonary artery systolic pressure. 11. The inferior vena cava is dilated in size with >50% respiratory variability, suggesting right atrial  pressure of 8 mmHg.  Cardiac cath 06/07/19 1. 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 Moderate nonobstructive LAD and LCx stenoses 3. Normal LV function by echo  Recommend: DAPT with ASA and clopidogrel x 12 months, OK for DC tomorrow am if no complications arise.   Coronary Diagrams  Diagnostic Dominance: Right  Intervention     Patient Profile     85 y.o. female with history of HTN who was admitted for NSTEMI.  Assessment & Plan    NSTEMI Patient was admitted with intermittent chest pressure at rest. Troponin peak at 1396. Echo showed normal EF without wall motion abnormalities. Patient preferred medical management at first but decided with family to undergo cardiac cath.  - Cath showed severe single vessel CAD in the mid RCA treated with DES; moderate nonobstructive LAD and LCx plan to treat medically. - Plan for DAPT with ASA and Plavix x 12 months - No further chest pain - lovastatin changed to rosuvastatin  - Imdur 30 mg stopped 2/2 presyncope/syncope. - Continue BB - Cath site is clean and dry - Creatinine 0.70 Hgb 10.3 - Cardiac Rehab to work with patient - Possible discharge today.  HTN  - Losartan/HCTZ held on admission - metoprolol switched to carvedilol 6.25mg  BID.  - Imdur held for hypotension - Pressures are labile>> Will discuss regimen with MD  HLD - continue rosuvastatin - LDL 86, goal <70  Syncope - witnessed episode likely form hypotension - Imdur stopped and BB decreased - Patient will likely need higher BP goal as outpatient   For questions or updates, please contact [facility] Please consult [email or web address] for contact info under        Signed, Cadence David StallH Furth, PA-C  06/08/2019, 7:55 AM    History and all data above reviewed.  Patient examined.  I agree with the findings as above.  Daisy FeeMarcella Becker feels well and anticipates hospital discharge.  Constitutional: No acute  distress Eyes: pupils equally round and reactive to light, sclera non-icteric, normal conjunctiva and lids ENMT: normal dentition, moist mucous membranes Cardiovascular: regular rhythm, normal rate, no murmurs. S1 and S2 normal. Radial pulses normal bilaterally. No jugular venous distention.  Respiratory: clear to auscultation bilaterally GI : normal bowel sounds, soft and nontender. No distention.   MSK: extremities warm, well perfused. No edema.  NEURO: grossly nonfocal exam, moves all extremities. PSYCH: alert and oriented x 3, normal mood and affect.   All available labs, radiology testing, previous records reviewed. Agree with documented assessment and plan of my colleague as stated above with the following additions or changes:  Principal Problem:   NSTEMI (non-ST elevated myocardial infarction) [facility](HCC) Active Problems:  Pure hypercholesterolemia    Plan: NSTEMI - continue asa, plavix, crestor, carvedilol. PCI to RCA yesterday.   HTN - losartan hctz on hold. I am concerned about her recent vasovagal episodes, and her blood pressure is labile. We may need to liberalize her goal BP however also avoiding hypertension.  I will see the patient in TOC follow up on 06/16/2019.    Time Spent Directly with Patient:  I have spent a total of 35 minutes with the patient reviewing hospital notes, telemetry, EKGs, labs and examining the patient as well as establishing an assessment and plan that was discussed personally with the patient.  > 50% of time was spent in direct patient care.  Length of Stay:  LOS: 3 days   Elouise Munroe, MD [facility] 12:42 PM  06/08/2019

## 2019-06-08 NOTE — Telephone Encounter (Signed)
I will be seeing this patient TOC in office 12/10 at patient's preference. Thanks, GA

## 2019-06-08 NOTE — Progress Notes (Addendum)
CARDIAC REHAB PHASE I   PRE:  Rate/Rhythm: 79 SR    BP: sitting 122/51    SaO2: 99 RA  MODE:  Ambulation: 370 ft   POST:  Rate/Rhythm: 96 SR    BP: sitting 135/66     SaO2: 100 RA  Pt c/o left thigh hurting and being weak. Sts it was very painful in the bed yesterday but improving today. Needed RW to help with support due to this leg. Tolerated walk well once holding RW, no CP or other c/o. Will need RW for home. To recliner, VSS. Will return to educate with daughter. [place], ACSM 06/08/2019 9:39 AM   Ed completed with pt and daughter. Receptive. Understands the importance of Plavix, restrictions, increasing walking, and CRPII. Will refer to [place] CRP[facility]. They have a RW and therefore do not need [place] to order one.  [id number] [place], ACSM 11:11 AM 06/08/2019

## 2019-06-08 NOTE — Plan of Care (Signed)

## 2019-06-09 ENCOUNTER — Telehealth: Payer: Self-pay | Admitting: Internal Medicine

## 2019-06-09 NOTE — Telephone Encounter (Signed)
I spoke to the patient's daughter Mardene Celeste) who was calling because the patient has been experiencing "tingling" in the hand which was accessed for Cardiac Cath.  She had the procedure on 12/1, so I told her to monitor over the weekend and call Monday 12/7 with update.  She verbalized understanding.

## 2019-06-09 NOTE — Telephone Encounter (Signed)
Pt c/o medication issue:  1. Name of Medication: carvedilol (COREG) 6.25 MG tablet  2. How are you currently taking this medication (dosage and times per day)? 1 Tablet twice a day, morning and night.  3. Are you having a reaction (difficulty breathing--STAT)? Yes   4. What is your medication issue? Patients daughter is calling stating patient is having tingling sensations in her hands that started at 5 AM this morning. They are wondering if it is due to the stent put in on 06/07/19 or a reaction to the medication. Please Advise.

## 2019-06-14 ENCOUNTER — Telehealth: Payer: Self-pay | Admitting: Cardiovascular Disease

## 2019-06-14 NOTE — Telephone Encounter (Signed)
Spoke with pt daughter. Pt on the line as well. Pt daughter states pt c/o of CP around 5am this morning. She states CP lasted for about 3 min with no additional symptoms. She confirms pt had heart cath 12/1 and has f/u appt 12/10 with Dr. Margaretann Loveless. Pt daughter states last BP check yesterday and was 155/60's. Pt states she has noticed that BP elevated. She states she avoids salt and her last labs showed low sodium.  Pt added that she felt a "twinge" to R side of chest earlier today and this lasted a few seconds. Reviewed current med list. Pt daughter confirmed all meds on current med list. Informed that CP after stent placement common. Advised to continue to monitor for MI symptoms, continue to monitor BP, can take nitro up to 3 tabs in 15 min--one every 5 min until CP resolves, call 911 for MI symptoms or CP unrelieved by nitro. Informed that message to be routed to Dr. Margaretann Loveless to advise. Pt and pt daughter agreeable

## 2019-06-14 NOTE — Telephone Encounter (Signed)
New Message   Per Patient's daughter had a stent put in 12/01 and had pain in chest around 5:00 am this morning but no pain at this time. Wants to know if this common after having stent surgery? Please advise.

## 2019-06-15 ENCOUNTER — Ambulatory Visit: Payer: Medicare Other | Admitting: Adult Health

## 2019-06-15 ENCOUNTER — Telehealth: Payer: Self-pay | Admitting: Internal Medicine

## 2019-06-15 NOTE — Telephone Encounter (Signed)
Returned call to patient's daughter she stated mother has appointment with Dr.Arcahrya tomorrow she was requesting any paperwork to be completed be emailed to her to fill out today.Advised unable to email those forms.Advised she can fill forms out in patient room while they wait on Dr.She will be coming back with mother.

## 2019-06-15 NOTE — Telephone Encounter (Signed)
New Message  Patient's daughter [name] is calling in to see if the paperwork that will have to be filled out at the appointment tomorrow can be emailed to her today so she can go ahead and fill it out. Please advise.  Alfonse Ras (pts daughter) [phone] [email or web address]

## 2019-06-16 ENCOUNTER — Ambulatory Visit (INDEPENDENT_AMBULATORY_CARE_PROVIDER_SITE_OTHER): Payer: Medicare Other | Admitting: Internal Medicine

## 2019-06-16 ENCOUNTER — Other Ambulatory Visit: Payer: Self-pay

## 2019-06-16 ENCOUNTER — Encounter: Payer: Self-pay | Admitting: Internal Medicine

## 2019-06-16 VITALS — BP 168/78 | HR 67 | Ht 64.0 in | Wt 151.0 lb

## 2019-06-16 DIAGNOSIS — E7800 Pure hypercholesterolemia, unspecified: Secondary | ICD-10-CM

## 2019-06-16 DIAGNOSIS — I10 Essential (primary) hypertension: Secondary | ICD-10-CM | POA: Diagnosis not present

## 2019-06-16 DIAGNOSIS — I214 Non-ST elevation (NSTEMI) myocardial infarction: Secondary | ICD-10-CM | POA: Diagnosis not present

## 2019-06-16 MED ORDER — LOSARTAN POTASSIUM 25 MG PO TABS: 25.0000 mg | ORAL_TABLET | Freq: Every day | ORAL | 3 refills | Status: DC

## 2019-06-16 NOTE — Patient Instructions (Signed)
Medication Instructions:   start taking Losartan  25 mg  One tablet daily   *If you need a refill on your cardiac medications before your next appointment, please call your pharmacy*  Lab Work: Not needed  Testing/Procedures: Not needed  Follow-Up: At [facility], you and your health needs are our priority.  As part of our continuing mission to provide you with exceptional heart care, we have created designated Provider Care Teams.  These Care Teams include your primary Cardiologist (physician) and Advanced Practice Providers (APPs -  Physician Assistants and Nurse Practitioners) who all work together to provide you with the care you need, when you need it.  Your next appointment:   1 month(s)  The format for your next appointment:   In Person  Provider:   Cherlynn Kaiser, MD  Other Instructions  [facility] in [place]- Dr Courtney Heys  Dr Sabino Snipes Dr Vergie Living

## 2019-07-04 NOTE — Progress Notes (Signed)
Cardiology Office Note:    Date:  06/16/2019   ID:  Daisy Becker, DOB October 06, 1924, MRN [id number]  PCP:  System, Pcp Not In  Cardiologist:  Elouise Munroe, MD  Electrophysiologist:  None   Referring MD: No ref. provider found   Chief Complaint: TOC 7 day visit - NSTEMI s/p PCI  History of Present Illness:    Daisy Becker is a 85 y.o. female with a hx of hypertension who presents today for transition of care follow-up after hospitalization for chest discomfort and dyspnea, resulting in non-STEMI with troponin to 1396.  I participated in the patient's care in the hospital and discussed cardiac catheterization with the patient and her family as documented in my progress notes.  She underwent coronary angiography with PCI, with angiography notable for severe single-vessel CAD involving the mid RCA treated with a resolute Onyx DES.  Plan is for dual antiplatelet therapy with aspirin and clopidogrel for 12 months.  She is doing well since hospital dismissal.  She has had some twinges of chest discomfort but nothing compared to what she presented with initially.  One will recall the patient also had a neurocardiogenic syncopal episode that was witnessed while in hospital.  She has a known history of vasovagal syncope per her report.  She was started on optimal medical therapy for CAD and angina while in hospital, however I discontinued her long-acting nitrate given this episode of neurocardiogenic syncope and lower blood pressures.  She presents today with her daughter Daisy Becker and I have answered their questions the best my ability.  The patient denies dyspnea at rest or with exertion, palpitations, PND, orthopnea, or leg swelling. Denies interval syncope or presyncope. Denies interval dizziness or lightheadedness.   Past Medical History:  Diagnosis Date  . Hypertension     Past Surgical History:  Procedure Laterality Date  . CORONARY STENT INTERVENTION N/A 06/07/2019   Procedure:  CORONARY STENT INTERVENTION;  Surgeon: Sherren Mocha, MD;  Location: [facility];  Service: Cardiovascular;  Laterality: N/A;  . LEFT HEART CATH AND CORONARY ANGIOGRAPHY N/A 06/07/2019   Procedure: LEFT HEART CATH AND CORONARY ANGIOGRAPHY;  Surgeon: Sherren Mocha, MD;  Location: [facility];  Service: Cardiovascular;  Laterality: N/A;    Current Medications: Current Meds  Medication Sig  . aspirin EC 81 MG EC tablet Take 1 tablet (81 mg total) by mouth daily.  . carvedilol (COREG) 6.25 MG tablet Take 1 tablet (6.25 mg total) by mouth 2 (two) times daily with a meal.  . clopidogrel (PLAVIX) 75 MG tablet Take 1 tablet (75 mg total) by mouth daily.  . nitroGLYCERIN (NITROSTAT) 0.4 MG SL tablet Place 1 tablet (0.4 mg total) under the tongue every 5 (five) minutes as needed for chest pain.  . rosuvastatin (CRESTOR) 20 MG tablet Take 1 tablet (20 mg total) by mouth daily at 6 PM.     Allergies:   Patient has no known allergies.   Social History   Socioeconomic History  . Marital status: Widowed    Spouse name: Not on file  . Number of children: Not on file  . Years of education: Not on file  . Highest education level: Not on file  Occupational History  . Not on file  Tobacco Use  . Smoking status: Never Smoker  . Smokeless tobacco: Never Used  Substance and Sexual Activity  . Alcohol use: No  . Drug use: No  . Sexual activity: Never  Other Topics Concern  . Not  on file  Social History Narrative  . Not on file   Social Determinants of Health   Financial Resource Strain:   . Difficulty of Paying Living Expenses: Not on file  Food Insecurity:   . Worried About Programme researcher, broadcasting/film/videounning Out of Food in the Last Year: Not on file  . Ran Out of Food in the Last Year: Not on file  Transportation Needs:   . Lack of Transportation (Medical): Not on file  . Lack of Transportation (Non-Medical): Not on file  Physical Activity:   . Days of Exercise per Week: Not on file  . Minutes of  Exercise per Session: Not on file  Stress:   . Feeling of Stress : Not on file  Social Connections:   . Frequency of Communication with Friends and Family: Not on file  . Frequency of Social Gatherings with Friends and Family: Not on file  . Attends Religious Services: Not on file  . Active Member of Clubs or Organizations: Not on file  . Attends BankerClub or Organization Meetings: Not on file  . Marital Status: Not on file     Family History: The patient's family history includes Heart disease in her daughter.  ROS:   Please see the history of present illness.    All other systems reviewed and are negative.  EKGs/Labs/Other Studies Reviewed:    The following studies were reviewed today:  EKG: Normal sinus rhythm, rate 67  Epworth Sleepiness Scale: N/A  Recent Labs: 06/08/2019: BUN 19; Creatinine, Ser 0.70; Hemoglobin 10.3; Platelets 149; Potassium 3.4; Sodium 131  Recent Lipid Panel    Component Value Date/Time   CHOL 161 06/06/2019 0358   TRIG 37 06/06/2019 0358   HDL 68 06/06/2019 0358   CHOLHDL 2.4 06/06/2019 0358   VLDL 7 06/06/2019 0358   LDLCALC 86 06/06/2019 0358    Physical Exam:    VS:  BP (!) 168/78   Pulse 67   Ht 5\' 4"  (1.626 m)   Wt 151 lb (68.5 kg)   SpO2 98%   BMI 25.92 kg/m     Wt Readings from Last 5 Encounters:  06/16/19 151 lb (68.5 kg)  06/08/19 150 lb 3.2 oz (68.1 kg)     Constitutional: No acute distress Eyes: sclera non-icteric, normal conjunctiva and lids ENMT: normal dentition, moist mucous membranes Cardiovascular: regular rhythm, normal rate, no murmurs. S1 and S2 normal. Radial pulses normal bilaterally. No jugular venous distention.  Respiratory: clear to auscultation bilaterally GI : normal bowel sounds, soft and nontender. No distention.   MSK: extremities warm, well perfused. No edema.  NEURO: grossly nonfocal exam, moves all extremities. PSYCH: alert and oriented x 3, normal mood and affect.    ASSESSMENT:    1. NSTEMI  (non-ST elevated myocardial infarction) (HCC)   2. Essential hypertension   3. Pure hypercholesterolemia    PLAN:    NSTEMI-status post PCI to the mid RCA.  Medical therapy includes DAPT x 12 mo. I have instructed the patient that dual antiplatelet therapy should be taken for 1 year without interruption.  We have discussed the consequences of interrupted dual antiplatelet therapy and the risk for in-stent thrombosis.  - plavix for 12 mo - indefinite ASA 81 mg daily - medical therapy includes: carvedilol 6.25 mg BID, crestor 20 mg daily.  Hypertension- - was previously on losartan hctz, stopped in hospital due to labile BP and vasovagal syncope.  -Given elevated blood pressure at home and here today, we will restart losartan 25 mg  daily.  I would like for her to follow-up soon to ensure that she is not having labile blood pressures or any presyncope or syncope.  She has requested the names of cardiologist in [place], [place], we will provide her with some general cardiology recommendations.  Hyperlipidemia-continue Crestor   TIME SPENT WITH PATIENT: 35 minutes of direct patient care. More than 50% of that time was spent on coordination of care and counseling regarding above-mentioned issues.  Weston Brass, MD [place]  [facility]   Medication Adjustments/Labs and Tests Ordered: Current medicines are reviewed at length with the patient today.  Concerns regarding medicines are outlined above.  Orders Placed This Encounter  Procedures  . EKG 12-Lead   Meds ordered this encounter  Medications  . losartan (COZAAR) 25 MG tablet    Sig: Take 1 tablet (25 mg total) by mouth daily.    Dispense:  90 tablet    Refill:  3    Patient Instructions  Medication Instructions:   start taking Losartan  25 mg  One tablet daily   *If you need a refill on your cardiac medications before your next appointment, please call your pharmacy*  Lab Work: Not needed   Testing/Procedures: Not needed  Follow-Up: At [facility], you and your health needs are our priority.  As part of our continuing mission to provide you with exceptional heart care, we have created designated Provider Care Teams.  These Care Teams include your primary Cardiologist (physician) and Advanced Practice Providers (APPs -  Physician Assistants and Nurse Practitioners) who all work together to provide you with the care you need, when you need it.  Your next appointment:   1 month(s)  The format for your next appointment:   In Person  Provider:   Weston Brass, MD

## 2019-07-27 ENCOUNTER — Other Ambulatory Visit: Payer: Self-pay

## 2019-07-27 ENCOUNTER — Encounter: Payer: Self-pay | Admitting: Internal Medicine

## 2019-07-27 ENCOUNTER — Ambulatory Visit (INDEPENDENT_AMBULATORY_CARE_PROVIDER_SITE_OTHER): Payer: Medicare Other | Admitting: Internal Medicine

## 2019-07-27 VITALS — BP 155/72 | HR 66 | Temp 97.0°F | Ht 62.0 in | Wt 155.0 lb

## 2019-07-27 DIAGNOSIS — I10 Essential (primary) hypertension: Secondary | ICD-10-CM | POA: Diagnosis not present

## 2019-07-27 DIAGNOSIS — E7800 Pure hypercholesterolemia, unspecified: Secondary | ICD-10-CM | POA: Diagnosis not present

## 2019-07-27 DIAGNOSIS — I214 Non-ST elevation (NSTEMI) myocardial infarction: Secondary | ICD-10-CM

## 2019-07-27 MED ORDER — CLOPIDOGREL BISULFATE 75 MG PO TABS: 75.0000 mg | ORAL_TABLET | Freq: Every day | ORAL | 3 refills | Status: DC

## 2019-07-27 MED ORDER — NITROGLYCERIN 0.4 MG SL SUBL: 0.4000 mg | SUBLINGUAL_TABLET | SUBLINGUAL | 1 refills | Status: DC | PRN

## 2019-07-27 MED ORDER — CARVEDILOL 6.25 MG PO TABS: 6.2500 mg | ORAL_TABLET | Freq: Two times a day (BID) | ORAL | 3 refills | Status: DC

## 2019-07-27 MED ORDER — LOSARTAN POTASSIUM 25 MG PO TABS: 25.0000 mg | ORAL_TABLET | Freq: Every day | ORAL | 3 refills | Status: DC

## 2019-07-27 MED ORDER — ROSUVASTATIN CALCIUM 20 MG PO TABS: 20.0000 mg | ORAL_TABLET | Freq: Every day | ORAL | 3 refills | Status: DC

## 2019-07-27 NOTE — Patient Instructions (Signed)
Medication Instructions:  No changes *If you need a refill on your cardiac medications before your next appointment, please call your pharmacy*  Lab Work: None ordered If you have labs (blood work) drawn today and your tests are completely normal, you will receive your results only by: . MyChart Message (if you have MyChart) OR . A paper copy in the mail If you have any lab test that is abnormal or we need to change your treatment, we will call you to review the results.  Testing/Procedures: None ordered  Follow-Up: At [facility], you and your health needs are our priority.  As part of our continuing mission to provide you with exceptional heart care, we have created designated Provider Care Teams.  These Care Teams include your primary Cardiologist (physician) and Advanced Practice Providers (APPs -  Physician Assistants and Nurse Practitioners) who all work together to provide you with the care you need, when you need it.  Your next appointment:   6 month(s)  The format for your next appointment:   In Person  Provider:   Gayatri Acharya, MD   

## 2019-08-07 NOTE — Progress Notes (Signed)
Cardiology Office Note:    Date:  07/27/2019   ID:  Daisy Becker, DOB Feb 01, 1925, MRN [id number]  PCP:  System, Pcp Not In  Cardiologist:  Elouise Munroe, MD  Electrophysiologist:  None   Referring MD: No ref. provider found   Chief Complaint: f/u HTN, recent history of NSTEMI s/p PCI  History of Present Illness:    Daisy Becker is a 86 y.o. female with a hx of hypertension who presents today for transition of care follow-up of blood pressure, after hospitalization for chest discomfort and dyspnea, resulting in non-STEMI with troponin to 1396.  I participated in the patient's care in the hospital. She underwent coronary angiography with PCI, with angiography notable for severe single-vessel CAD involving the mid RCA treated with a resolute Onyx DES.  Plan is for dual antiplatelet therapy with aspirin and clopidogrel for 12 months.  The patient also had a neurocardiogenic syncopal episode that was witnessed while in hospital.  She has a known history of vasovagal syncope per her report.  She was started on optimal medical therapy for CAD and angina while in hospital, however I discontinued her long-acting nitrate given this episode of neurocardiogenic syncope and lower blood pressures. When she presented for post hospital follow up, her blood pressure was significantly elevated, and we restarted her losartan 25 mg daily.   She presents today with her daughter [name] who I met in the hospital. I also met her daughter [name] during the hospital stay, and spoke to her grandson [name] by phone.   Past Medical History:  Diagnosis Date  . Hypertension     Past Surgical History:  Procedure Laterality Date  . CORONARY STENT INTERVENTION N/A 06/07/2019   Procedure: CORONARY STENT INTERVENTION;  Surgeon: Sherren Mocha, MD;  Location: [place] [facility];  Service: Cardiovascular;  Laterality: N/A;  . LEFT HEART CATH AND CORONARY ANGIOGRAPHY N/A 06/07/2019   Procedure: LEFT HEART CATH AND  CORONARY ANGIOGRAPHY;  Surgeon: Sherren Mocha, MD;  Location: [place] [facility];  Service: Cardiovascular;  Laterality: N/A;    Current Medications: Current Meds  Medication Sig  . aspirin EC 81 MG tablet Take 81 mg by mouth daily.  . carvedilol (COREG) 6.25 MG tablet Take 1 tablet (6.25 mg total) by mouth 2 (two) times daily with a meal.  . clopidogrel (PLAVIX) 75 MG tablet Take 1 tablet (75 mg total) by mouth daily.  Marland Kitchen losartan (COZAAR) 25 MG tablet Take 1 tablet (25 mg total) by mouth daily.  . nitroGLYCERIN (NITROSTAT) 0.4 MG SL tablet Place 1 tablet (0.4 mg total) under the tongue every 5 (five) minutes as needed for chest pain.  . rosuvastatin (CRESTOR) 20 MG tablet Take 1 tablet (20 mg total) by mouth daily at 6 PM.  . [DISCONTINUED] carvedilol (COREG) 6.25 MG tablet Take 1 tablet (6.25 mg total) by mouth 2 (two) times daily with a meal.  . [DISCONTINUED] clopidogrel (PLAVIX) 75 MG tablet Take 75 mg by mouth daily.  . [DISCONTINUED] losartan (COZAAR) 25 MG tablet Take 1 tablet (25 mg total) by mouth daily.  . [DISCONTINUED] nitroGLYCERIN (NITROSTAT) 0.4 MG SL tablet Place 1 tablet (0.4 mg total) under the tongue every 5 (five) minutes as needed for chest pain.  . [DISCONTINUED] rosuvastatin (CRESTOR) 20 MG tablet Take 1 tablet (20 mg total) by mouth daily at 6 PM.     Allergies:   Patient has no known allergies.   Social History   Socioeconomic History  . Marital status: Widowed  Spouse name: Not on file  . Number of children: Not on file  . Years of education: Not on file  . Highest education level: Not on file  Occupational History  . Not on file  Tobacco Use  . Smoking status: Never Smoker  . Smokeless tobacco: Never Used  Substance and Sexual Activity  . Alcohol use: No  . Drug use: No  . Sexual activity: Never  Other Topics Concern  . Not on file  Social History Narrative  . Not on file   Social Determinants of Health   Financial Resource Strain:   .  Difficulty of Paying Living Expenses: Not on file  Food Insecurity:   . Worried About Charity fundraiser in the Last Year: Not on file  . Ran Out of Food in the Last Year: Not on file  Transportation Needs:   . Lack of Transportation (Medical): Not on file  . Lack of Transportation (Non-Medical): Not on file  Physical Activity:   . Days of Exercise per Week: Not on file  . Minutes of Exercise per Session: Not on file  Stress:   . Feeling of Stress : Not on file  Social Connections:   . Frequency of Communication with Friends and Family: Not on file  . Frequency of Social Gatherings with Friends and Family: Not on file  . Attends Religious Services: Not on file  . Active Member of Clubs or Organizations: Not on file  . Attends Archivist Meetings: Not on file  . Marital Status: Not on file     Family History: The patient's family history includes Heart disease in her daughter.  ROS:   Please see the history of present illness.    All other systems reviewed and are negative.  EKGs/Labs/Other Studies Reviewed:    The following studies were reviewed today:  EKG:  Not performed today  Recent Labs: 06/08/2019: BUN 19; Creatinine, Ser 0.70; Hemoglobin 10.3; Platelets 149; Potassium 3.4; Sodium 131  Recent Lipid Panel    Component Value Date/Time   CHOL 161 06/06/2019 0358   TRIG 37 06/06/2019 0358   HDL 68 06/06/2019 0358   CHOLHDL 2.4 06/06/2019 0358   VLDL 7 06/06/2019 0358   LDLCALC 86 06/06/2019 0358    Physical Exam:    VS:  BP (!) 155/72 (BP Location: Left Arm, Patient Position: Sitting, Cuff Size: Normal)   Pulse 66   Temp (!) 97 F (36.1 C)   Ht _0  (1.575 m)   Wt 155 lb (70.3 kg)   SpO2 100%   BMI 28.35 kg/m     Wt Readings from Last 5 Encounters:  07/27/19 155 lb (70.3 kg)  06/16/19 151 lb (68.5 kg)  06/08/19 150 lb 3.2 oz (68.1 kg)     Constitutional: No acute distress Eyes: sclera non-icteric, normal conjunctiva and lids ENMT: normal  dentition, moist mucous membranes Cardiovascular: regular rhythm, normal rate, no murmurs. S1 and S2 normal. Radial pulses normal bilaterally. No jugular venous distention.  Respiratory: clear to auscultation bilaterally GI : normal bowel sounds, soft and nontender. No distention.   MSK: extremities warm, well perfused. No edema.  NEURO: grossly nonfocal exam, moves all extremities. PSYCH: alert and oriented x 3, normal mood and affect.   ASSESSMENT:    1. NSTEMI (non-ST elevated myocardial infarction) ([facility])   2. Essential hypertension   3. Pure hypercholesterolemia    PLAN:    NSTEMI-status post PCI to the mid RCA.  Medical therapy includes  DAPT x 12 mo.  I have instructed the patient that dual antiplatelet therapy should be taken for 1 year without interruption.  We have discussed the consequences of interrupted dual antiplatelet therapy and the risk for in-stent thrombosis.  - plavix for 12 mo - indefinite ASA 81 mg daily - medical therapy includes: carvedilol 6.25 mg BID, crestor 20 mg daily.  HTN - previously taking losartan HCTZ, stopped due to labile BP and neurocardiogenic syncope.  - BP remains mildly elevated however we must balance this with the risk of hypotension and precipitating vasovagal events. I will leave her losartan at 25 mg daily. [name], Ms. Primitivo Gauze, and I participated in shared decision making regarding this decision and we are in agreement.    HLD - continue Crestor.  I look forward to seeing Ms. Arntz anytime she is in [place], however she will likely be seeking a Arts administrator as well in [place], [place].   Total time of encounter: 30 minutes total time of encounter, including 20 minutes spent in face-to-face patient care. This time includes coordination of care and counseling regarding above issues. Remainder of non-face-to-face time involved reviewing chart documents/testing relevant to the patient encounter and documentation in the medical  record.  Cherlynn Kaiser, MD [place]  [facility]   Medication Adjustments/Labs and Tests Ordered: Current medicines are reviewed at length with the patient today.  Concerns regarding medicines are outlined above.  No orders of the defined types were placed in this encounter.  Meds ordered this encounter  Medications  . carvedilol (COREG) 6.25 MG tablet    Sig: Take 1 tablet (6.25 mg total) by mouth 2 (two) times daily with a meal.    Dispense:  180 tablet    Refill:  3  . clopidogrel (PLAVIX) 75 MG tablet    Sig: Take 1 tablet (75 mg total) by mouth daily.    Dispense:  90 tablet    Refill:  3  . losartan (COZAAR) 25 MG tablet    Sig: Take 1 tablet (25 mg total) by mouth daily.    Dispense:  90 tablet    Refill:  3  . nitroGLYCERIN (NITROSTAT) 0.4 MG SL tablet    Sig: Place 1 tablet (0.4 mg total) under the tongue every 5 (five) minutes as needed for chest pain.    Dispense:  25 tablet    Refill:  1  . rosuvastatin (CRESTOR) 20 MG tablet    Sig: Take 1 tablet (20 mg total) by mouth daily at 6 PM.    Dispense:  90 tablet    Refill:  3    Patient Instructions  Medication Instructions:  No changes *If you need a refill on your cardiac medications before your next appointment, please call your pharmacy*  Lab Work: None ordered If you have labs (blood work) drawn today and your tests are completely normal, you will receive your results only by: Marland Kitchen MyChart Message (if you have MyChart) OR . A paper copy in the mail If you have any lab test that is abnormal or we need to change your treatment, we will call you to review the results.  Testing/Procedures: None ordered  Follow-Up: At [facility], you and your health needs are our priority.  As part of our continuing mission to provide you with exceptional heart care, we have created designated Provider Care Teams.  These Care Teams include your primary Cardiologist (physician) and Advanced Practice Providers (APPs -   Physician Assistants and Nurse Practitioners) who  all work together to provide you with the care you need, when you need it.  Your next appointment:   6 month(s)  The format for your next appointment:   In Person  Provider:   Cherlynn Kaiser, MD

## 2019-12-27 ENCOUNTER — Encounter: Payer: Self-pay | Admitting: Sports Medicine

## 2019-12-27 ENCOUNTER — Other Ambulatory Visit: Payer: Self-pay

## 2019-12-27 ENCOUNTER — Ambulatory Visit: Payer: Medicare Other | Admitting: Sports Medicine

## 2019-12-27 DIAGNOSIS — M79674 Pain in right toe(s): Secondary | ICD-10-CM

## 2019-12-27 DIAGNOSIS — Z7901 Long term (current) use of anticoagulants: Secondary | ICD-10-CM

## 2019-12-27 DIAGNOSIS — M79675 Pain in left toe(s): Secondary | ICD-10-CM | POA: Diagnosis not present

## 2019-12-27 DIAGNOSIS — B351 Tinea unguium: Secondary | ICD-10-CM | POA: Diagnosis not present

## 2019-12-27 DIAGNOSIS — I739 Peripheral vascular disease, unspecified: Secondary | ICD-10-CM

## 2019-12-27 NOTE — Progress Notes (Signed)
Subjective: Daisy Becker is a 86 y.o. female patient seen today in office with complaint of mildly painful thickened and elongated toenails; unable to trim. Patient denies history of Diabetes, or neuropathy but does have a history of PVD and cardiac history currently on Plavix.  Reports that she used to see a podiatrist in [place] but then that podiatrist moved and it has been very difficult for her to been a care for her toenails because of the thickness and also because of her hip and knee issues cannot be in to trim her toenails.  Patient has no other pedal complaints at this time.   Patient is assisted by daughter this visit.  Patient Active Problem List   Diagnosis Date Noted  . NSTEMI (non-ST elevated myocardial infarction) (HCC) 06/05/2019  . Essential hypertension   . Pure hypercholesterolemia   . Asymptomatic cholelithiasis 06/17/2011  . Other and unspecified hyperlipidemia 05/15/2011  . Melanoma in situ (HCC) 05/15/2011  . History of gastroesophageal reflux (GERD) 05/15/2011    Current Outpatient Medications on File Prior to Visit  Medication Sig Dispense Refill  . aspirin EC 81 MG tablet Take 81 mg by mouth daily.    . clopidogrel (PLAVIX) 75 MG tablet Take 1 tablet (75 mg total) by mouth daily. 90 tablet 3  . nitroGLYCERIN (NITROSTAT) 0.4 MG SL tablet Place 1 tablet (0.4 mg total) under the tongue every 5 (five) minutes as needed for chest pain. 25 tablet 1  . carvedilol (COREG) 6.25 MG tablet Take 1 tablet (6.25 mg total) by mouth 2 (two) times daily with a meal. 180 tablet 3  . losartan (COZAAR) 25 MG tablet Take 1 tablet (25 mg total) by mouth daily. 90 tablet 3  . rosuvastatin (CRESTOR) 20 MG tablet Take 1 tablet (20 mg total) by mouth daily at 6 PM. 90 tablet 3   No current facility-administered medications on file prior to visit.    No Known Allergies  Objective: Physical Exam  General: Well developed, nourished, no acute distress, awake, alert and  oriented x 3  Vascular: Dorsalis pedis artery 1/4 bilateral, Posterior tibial artery 0/4 bilateral, skin temperature warm to warm proximal to distal bilateral lower extremities, moderate varicosities, trace edema to ankles, scant pedal hair present bilateral.  Neurological: Gross sensation present via light touch bilateral.   Dermatological: Skin is warm, dry, and supple bilateral, Nails 1-10 are tender, long, thick, and discolored with mild subungal debris, no webspace macerations present bilateral, no open lesions present bilateral, no callus/corns/hyperkeratotic tissue present bilateral. No signs of infection bilateral.  Musculoskeletal: Asymptomatic boney hammertoe and bunion deformities noted bilateral.  Pes planus foot type bilateral.  Muscular strength within normal limits without painon range of motion acceptable for patient status. No pain with calf compression bilateral.  Assessment and Plan:  Problem List Items Addressed This Visit    None    Visit Diagnoses    Pain due to onychomycosis of toenails of both feet    -  Primary   Current use of anticoagulant therapy       PVD (peripheral vascular disease) (HCC)          -Examined patient.  -Discussed treatment options for painful mycotic nails. -Mechanically debrided and reduced mycotic nails with sterile nail nipper and dremel nail file without major incident small nick was treated with Lumicain at left hallux nail -Encourage elevation and compression garments for edema control -Patient to return in 3 months for follow up evaluation or sooner if symptoms worsen.  Landis Martins, DPM

## 2020-01-03 ENCOUNTER — Other Ambulatory Visit: Payer: Self-pay | Admitting: Internal Medicine

## 2020-02-07 ENCOUNTER — Encounter: Payer: Self-pay | Admitting: Internal Medicine

## 2020-02-07 ENCOUNTER — Ambulatory Visit: Payer: Medicare Other | Admitting: Internal Medicine

## 2020-02-07 ENCOUNTER — Other Ambulatory Visit: Payer: Self-pay

## 2020-02-07 VITALS — BP 134/80 | HR 66 | Ht 60.0 in | Wt 143.6 lb

## 2020-02-07 DIAGNOSIS — I10 Essential (primary) hypertension: Secondary | ICD-10-CM | POA: Diagnosis not present

## 2020-02-07 DIAGNOSIS — I214 Non-ST elevation (NSTEMI) myocardial infarction: Secondary | ICD-10-CM

## 2020-02-07 MED ORDER — LOSARTAN POTASSIUM 25 MG PO TABS: 25.0000 mg | ORAL_TABLET | Freq: Every day | ORAL | 3 refills | Status: DC

## 2020-02-07 MED ORDER — CARVEDILOL 6.25 MG PO TABS: 6.2500 mg | ORAL_TABLET | Freq: Two times a day (BID) | ORAL | 3 refills | Status: DC

## 2020-02-07 MED ORDER — NITROGLYCERIN 0.4 MG SL SUBL: 0.4000 mg | SUBLINGUAL_TABLET | SUBLINGUAL | 3 refills | Status: DC | PRN

## 2020-02-07 MED ORDER — CLOPIDOGREL BISULFATE 75 MG PO TABS: 75.0000 mg | ORAL_TABLET | Freq: Every day | ORAL | 0 refills | Status: DC

## 2020-02-07 MED ORDER — ROSUVASTATIN CALCIUM 20 MG PO TABS: 20.0000 mg | ORAL_TABLET | Freq: Every day | ORAL | 3 refills | Status: DC

## 2020-02-07 NOTE — Patient Instructions (Signed)
Medication Instructions:  Stop Plavix- as of December 1st 2021 *If you need a refill on your cardiac medications before your next appointment, please call your pharmacy*   Lab Work: None Ordered If you have labs (blood work) drawn today and your tests are completely normal, you will receive your results only by: Marland Kitchen MyChart Message (if you have MyChart) OR . A paper copy in the mail If you have any lab test that is abnormal or we need to change your treatment, we will call you to review the results.   Testing/Procedures: None Ordered   Follow-Up: At [facility], you and your health needs are our priority.  As part of our continuing mission to provide you with exceptional heart care, we have created designated Provider Care Teams.  These Care Teams include your primary Cardiologist (physician) and Advanced Practice Providers (APPs -  Physician Assistants and Nurse Practitioners) who all work together to provide you with the care you need, when you need it.  We recommend signing up for the patient portal called "MyChart".  Sign up information is provided on this After Visit Summary.  MyChart is used to connect with patients for Virtual Visits (Telemedicine).  Patients are able to view lab/test results, encounter notes, upcoming appointments, etc.  Non-urgent messages can be sent to your provider as well.   To learn more about what you can do with MyChart, go to [email or web address].    Your next appointment:   6 month(s)  The format for your next appointment:   In Person  Provider:   Weston Brass, MD   Other Instructions

## 2020-02-07 NOTE — Progress Notes (Signed)
Cardiology Office Note:    Date:  02/07/20  ID:  Daisy Becker, DOB October 04, 1924, MRN [id number]  PCP:  System, Pcp Not In  Cardiologist:  Elouise Munroe, MD  Electrophysiologist:  None   Referring MD: No ref. provider found   Chief Complaint: f/u HTN, recent history of NSTEMI s/p PCI  History of Present Illness:    Daisy Becker is a 86 y.o. female with a hx of hypertension who presents today for follow-up of blood pressure, after hospitalization for chest discomfort and dyspnea, resulting in non-STEMI with troponin to 1396.  I participated in the patient's care in the hospital. She underwent coronary angiography with PCI, with angiography notable for severe single-vessel CAD involving the mid RCA treated with a resolute Onyx DES.  Plan is for dual antiplatelet therapy with aspirin and clopidogrel for 12 months.  The patient also had a neurocardiogenic syncopal episode that was witnessed while in hospital.  She has a known history of vasovagal syncope per her report.  She was started on optimal medical therapy for CAD and angina while in hospital, however I discontinued her long-acting nitrate given this episode of neurocardiogenic syncope and lower blood pressures. When she presented for post hospital follow up, her blood pressure was significantly elevated, and we restarted her losartan 25 mg daily.   She presents today with her daughter Daisy Becker who I met in the hospital. I also met her daughter Daisy Becker during the hospital stay, and spoke to her grandson Daisy Becker by phone.   She continues to do well. She has had one fleeting episode of chest pain during the last 8 months. She also had a significant fall with scalp laceration, she was trying to pull the vacuum cleaner down a couple steps and fell backward, hitting her head on the edge of a wall and sustaining a lac requiring stitches.   Past Medical History:  Diagnosis Date   Hypertension     Past Surgical History:  Procedure  Laterality Date   CORONARY STENT INTERVENTION N/A 06/07/2019   Procedure: CORONARY STENT INTERVENTION;  Surgeon: Sherren Mocha, MD;  Location: [facility];  Service: Cardiovascular;  Laterality: N/A;   LEFT HEART CATH AND CORONARY ANGIOGRAPHY N/A 06/07/2019   Procedure: LEFT HEART CATH AND CORONARY ANGIOGRAPHY;  Surgeon: Sherren Mocha, MD;  Location: [facility];  Service: Cardiovascular;  Laterality: N/A;    Current Medications: Current Meds  Medication Sig   aspirin EC 81 MG tablet Take 81 mg by mouth daily.   clopidogrel (PLAVIX) 75 MG tablet Take 1 tablet (75 mg total) by mouth daily. Stop taking as of December 1st 2021.   nitroGLYCERIN (NITROSTAT) 0.4 MG SL tablet Place 1 tablet (0.4 mg total) under the tongue every 5 (five) minutes as needed for chest pain.   [DISCONTINUED] clopidogrel (PLAVIX) 75 MG tablet Take 1 tablet (75 mg total) by mouth daily.   [DISCONTINUED] nitroGLYCERIN (NITROSTAT) 0.4 MG SL tablet Place 1 tablet (0.4 mg total) under the tongue every 5 (five) minutes as needed for chest pain.     Allergies:   Patient has no known allergies.   Social History   Socioeconomic History   Marital status: Widowed    Spouse name: Not on file   Number of children: Not on file   Years of education: Not on file   Highest education level: Not on file  Occupational History   Not on file  Tobacco Use   Smoking status: Never Smoker   Smokeless  tobacco: Never Used  Substance and Sexual Activity   Alcohol use: No   Drug use: No   Sexual activity: Never  Other Topics Concern   Not on file  Social History Narrative   Not on file   Social Determinants of Health   Financial Resource Strain:    Difficulty of Paying Living Expenses:   Food Insecurity:    Worried About Charity fundraiser in the Last Year:    Arboriculturist in the Last Year:   Transportation Needs:    Film/video editor (Medical):    Lack of Transportation  (Non-Medical):   Physical Activity:    Days of Exercise per Week:    Minutes of Exercise per Session:   Stress:    Feeling of Stress :   Social Connections:    Frequency of Communication with Friends and Family:    Frequency of Social Gatherings with Friends and Family:    Attends Religious Services:    Active Member of Clubs or Organizations:    Attends Archivist Meetings:    Marital Status:      Family History: The patient's family history includes Heart disease in her daughter.  ROS:   Please see the history of present illness.    All other systems reviewed and are negative.  EKGs/Labs/Other Studies Reviewed:    The following studies were reviewed today:  EKG:  Not performed today  Recent Labs: 06/08/2019: BUN 19; Creatinine, Ser 0.70; Hemoglobin 10.3; Platelets 149; Potassium 3.4; Sodium 131  Recent Lipid Panel    Component Value Date/Time   CHOL 161 06/06/2019 0358   TRIG 37 06/06/2019 0358   HDL 68 06/06/2019 0358   CHOLHDL 2.4 06/06/2019 0358   VLDL 7 06/06/2019 0358   LDLCALC 86 06/06/2019 0358    Physical Exam:    VS:  BP 134/80 (BP Location: Left Arm, Patient Position: Sitting, Cuff Size: Normal)    Pulse 66    Ht 5' (1.524 m)    Wt 143 lb 9.6 oz (65.1 kg)    BMI 28.04 kg/m     Wt Readings from Last 5 Encounters:  02/07/20 143 lb 9.6 oz (65.1 kg)  07/27/19 155 lb (70.3 kg)  06/16/19 151 lb (68.5 kg)  06/08/19 150 lb 3.2 oz (68.1 kg)     Constitutional: No acute distress Eyes: sclera non-icteric, normal conjunctiva and lids ENMT: normal dentition, moist mucous membranes Cardiovascular: regular rhythm, normal rate, no murmurs. S1 and S2 normal. Radial pulses normal bilaterally. No jugular venous distention.  Respiratory: clear to auscultation bilaterally GI : normal bowel sounds, soft and nontender. No distention.   MSK: extremities warm, well perfused. No edema.  NEURO: grossly nonfocal exam, moves all extremities. PSYCH: alert  and oriented x 3, normal mood and affect.   ASSESSMENT:    1. NSTEMI (non-ST elevated myocardial infarction) ([place])   2. Essential hypertension    PLAN:    NSTEMI-status post PCI to the mid RCA.  Medical therapy includes DAPT x 12 mo.  I have instructed the patient that dual antiplatelet therapy should be taken for 1 year without interruption.  We have discussed the consequences of interrupted dual antiplatelet therapy and the risk for in-stent thrombosis.  - plavix for 12 mo, can stop June 06, 2020.  - continue ASA, BB, statin.   HTN - BP with reasonable control, continue coreg 6.25 mg BID, losartan 25 mg daily.   HLD - continue crestor.   Total  time of encounter: 35 minutes total time of encounter, including 25 minutes spent in face-to-face patient care. This time includes coordination of care and counseling regarding above issues. Remainder of non-face-to-face time involved reviewing chart documents/testing relevant to the patient encounter and documentation in the medical record.  Cherlynn Kaiser, MD [place]   [facility]   Medication Adjustments/Labs and Tests Ordered: Current medicines are reviewed at length with the patient today.  Concerns regarding medicines are outlined above.  Orders Placed This Encounter  Procedures   EKG 12-Lead   Meds ordered this encounter  Medications   carvedilol (COREG) 6.25 MG tablet    Sig: Take 1 tablet (6.25 mg total) by mouth 2 (two) times daily with a meal.    Dispense:  180 tablet    Refill:  3   losartan (COZAAR) 25 MG tablet    Sig: Take 1 tablet (25 mg total) by mouth daily.    Dispense:  90 tablet    Refill:  3   nitroGLYCERIN (NITROSTAT) 0.4 MG SL tablet    Sig: Place 1 tablet (0.4 mg total) under the tongue every 5 (five) minutes as needed for chest pain.    Dispense:  25 tablet    Refill:  3   rosuvastatin (CRESTOR) 20 MG tablet    Sig: Take 1 tablet (20 mg total) by mouth daily at 6 PM.    Dispense:  90  tablet    Refill:  3   clopidogrel (PLAVIX) 75 MG tablet    Sig: Take 1 tablet (75 mg total) by mouth daily. Stop taking as of December 1st 2021.    Dispense:  90 tablet    Refill:  0    Patient Instructions  Medication Instructions:  Stop Plavix- as of December 1st 2021 *If you need a refill on your cardiac medications before your next appointment, please call your pharmacy*   Lab Work: None Ordered If you have labs (blood work) drawn today and your tests are completely normal, you will receive your results only by:  [place] (if you have MyChart) OR  A paper copy in the mail If you have any lab test that is abnormal or we need to change your treatment, we will call you to review the results.   Testing/Procedures: None Ordered   Follow-Up: At [facility], you and your health needs are our priority.  As part of our continuing mission to provide you with exceptional heart care, we have created designated Provider Care Teams.  These Care Teams include your primary Cardiologist (physician) and Advanced Practice Providers (APPs -  Physician Assistants and Nurse Practitioners) who all work together to provide you with the care you need, when you need it.  We recommend signing up for the patient portal called "MyChart".  Sign up information is provided on this After Visit Summary.  MyChart is used to connect with patients for Virtual Visits (Telemedicine).  Patients are able to view lab/test results, encounter notes, upcoming appointments, etc.  Non-urgent messages can be sent to your provider as well.   To learn more about what you can do with MyChart, go to [email or web address].    Your next appointment:   6 month(s)  The format for your next appointment:   In Person  Provider:   Cherlynn Kaiser, MD   Other Instructions

## 2020-04-12 ENCOUNTER — Other Ambulatory Visit: Payer: Self-pay

## 2020-04-12 ENCOUNTER — Ambulatory Visit: Payer: Medicare Other | Admitting: Sports Medicine

## 2020-04-12 ENCOUNTER — Encounter: Payer: Self-pay | Admitting: Sports Medicine

## 2020-04-12 DIAGNOSIS — B351 Tinea unguium: Secondary | ICD-10-CM | POA: Diagnosis not present

## 2020-04-12 DIAGNOSIS — M79674 Pain in right toe(s): Secondary | ICD-10-CM | POA: Diagnosis not present

## 2020-04-12 DIAGNOSIS — M79675 Pain in left toe(s): Secondary | ICD-10-CM

## 2020-04-12 DIAGNOSIS — Z7901 Long term (current) use of anticoagulants: Secondary | ICD-10-CM

## 2020-04-12 DIAGNOSIS — I739 Peripheral vascular disease, unspecified: Secondary | ICD-10-CM

## 2020-04-12 NOTE — Progress Notes (Signed)
Subjective: Daisy Becker is a 86 y.o. female patient seen today in office with complaint of mildly painful thickened and elongated toenails; unable to trim.  Reports that she is still on Plavix.  Denies any changes with medication.  No changes with health history since last encounter.  Patient is assisted by daughter this visit.  Patient Active Problem List   Diagnosis Date Noted  . NSTEMI (non-ST elevated myocardial infarction) (HCC) 06/05/2019  . Essential hypertension   . Pure hypercholesterolemia   . Asymptomatic cholelithiasis 06/17/2011  . Other and unspecified hyperlipidemia 05/15/2011  . Melanoma in situ (HCC) 05/15/2011  . History of gastroesophageal reflux (GERD) 05/15/2011    Current Outpatient Medications on File Prior to Visit  Medication Sig Dispense Refill  . aspirin EC 81 MG tablet Take 81 mg by mouth daily.    . carvedilol (COREG) 6.25 MG tablet Take 1 tablet (6.25 mg total) by mouth 2 (two) times daily with a meal. 180 tablet 3  . clopidogrel (PLAVIX) 75 MG tablet Take 1 tablet (75 mg total) by mouth daily. Stop taking as of December 1st 2021. 90 tablet 0  . losartan (COZAAR) 25 MG tablet Take 1 tablet (25 mg total) by mouth daily. 90 tablet 3  . nitroGLYCERIN (NITROSTAT) 0.4 MG SL tablet Place 1 tablet (0.4 mg total) under the tongue every 5 (five) minutes as needed for chest pain. 25 tablet 3  . rosuvastatin (CRESTOR) 20 MG tablet Take 1 tablet (20 mg total) by mouth daily at 6 PM. 90 tablet 3   No current facility-administered medications on file prior to visit.    No Known Allergies  Objective: Physical Exam  General: Well developed, nourished, no acute distress, awake, alert and oriented x 3  Vascular: Dorsalis pedis artery 1/4 bilateral, Posterior tibial artery 0/4 bilateral, skin temperature warm to warm proximal to distal bilateral lower extremities, moderate varicosities, trace edema to ankles, scant pedal hair present bilateral.  Neurological:  Gross sensation present via light touch bilateral.   Dermatological: Skin is warm, dry, and supple bilateral, Nails 1-10 are tender, long, thick, and discolored with mild subungal debris, no webspace macerations present bilateral, no open lesions present bilateral, no callus/corns/hyperkeratotic tissue present bilateral. No signs of infection bilateral.  Musculoskeletal: Asymptomatic boney hammertoe and bunion deformities noted bilateral.  Pes planus foot type bilateral.  Muscular strength within normal limits without painon range of motion acceptable for patient status. No pain with calf compression bilateral.  Assessment and Plan:  Problem List Items Addressed This Visit    None    Visit Diagnoses    Pain due to onychomycosis of toenails of both feet    -  Primary   Current use of anticoagulant therapy       PVD (peripheral vascular disease) (HCC)          -Examined patient.  -Discussed treatment options for painful mycotic nails. -Mechanically debrided and reduced mycotic nails with sterile nail nipper and dremel nail file without incident. -Encourage elevation and compression garments for edema control like previous -Patient to return in 3 months for follow up evaluation or sooner if symptoms worsen.  Asencion Islam, DPM

## 2020-06-27 ENCOUNTER — Other Ambulatory Visit: Payer: Self-pay

## 2020-06-27 ENCOUNTER — Emergency Department (HOSPITAL_COMMUNITY): Payer: Medicare Other

## 2020-06-27 ENCOUNTER — Encounter (HOSPITAL_COMMUNITY): Payer: Self-pay | Admitting: Emergency Medicine

## 2020-06-27 ENCOUNTER — Emergency Department (HOSPITAL_COMMUNITY)
Admission: EM | Admit: 2020-06-27 | Discharge: 2020-06-28 | Disposition: A | Discharge status: Home / Self Care | Payer: Medicare Other | Attending: Emergency Medicine | Admitting: Emergency Medicine

## 2020-06-27 DIAGNOSIS — I10 Essential (primary) hypertension: Secondary | ICD-10-CM | POA: Insufficient documentation

## 2020-06-27 DIAGNOSIS — Z955 Presence of coronary angioplasty implant and graft: Secondary | ICD-10-CM | POA: Insufficient documentation

## 2020-06-27 DIAGNOSIS — M545 Low back pain, unspecified: Secondary | ICD-10-CM | POA: Diagnosis not present

## 2020-06-27 DIAGNOSIS — S0003XA Contusion of scalp, initial encounter: Secondary | ICD-10-CM | POA: Insufficient documentation

## 2020-06-27 DIAGNOSIS — Z7982 Long term (current) use of aspirin: Secondary | ICD-10-CM | POA: Diagnosis not present

## 2020-06-27 DIAGNOSIS — S0990XA Unspecified injury of head, initial encounter: Secondary | ICD-10-CM | POA: Diagnosis present

## 2020-06-27 DIAGNOSIS — Y92009 Unspecified place in unspecified non-institutional (private) residence as the place of occurrence of the external cause: Secondary | ICD-10-CM

## 2020-06-27 DIAGNOSIS — W108XXA Fall (on) (from) other stairs and steps, initial encounter: Secondary | ICD-10-CM | POA: Insufficient documentation

## 2020-06-27 DIAGNOSIS — Z79899 Other long term (current) drug therapy: Secondary | ICD-10-CM | POA: Diagnosis not present

## 2020-06-27 DIAGNOSIS — W19XXXA Unspecified fall, initial encounter: Secondary | ICD-10-CM

## 2020-06-27 NOTE — ED Triage Notes (Signed)
Pt arrived POV, reports she fell going up the stairs with her hands full, pt hit her head, reports she stopped taking her plavix on Dec. 1. Denies LOC. Verbal order CT scans from Dr.Yao.

## 2020-06-28 MED ORDER — ACETAMINOPHEN 325 MG PO TABS
650.0000 mg | ORAL_TABLET | Freq: Once | ORAL | Status: AC
  Administered 2020-06-28: 650 mg via ORAL
  Filled 2020-06-28: qty 2

## 2020-06-28 NOTE — ED Notes (Signed)
Patient verbalizes understanding of discharge instructions. Opportunity for questioning and answers were provided. Armband removed by staff, pt discharged from ED via wheelchair to lobby to return home with family.  

## 2020-06-28 NOTE — ED Provider Notes (Signed)
[facility] EMERGENCY DEPARTMENT Provider Note   CSN: [id number] Arrival date & time: 06/27/20  1813     History Chief Complaint  Patient presents with  . Fall    Daisy Becker is a 86 y.o. female.  HPI Patient was carrying some bundles up stairs.  She reports she lost her balance and fell backwards striking her head on the banister.  She reports she got a really big, painful lump on the back of her head.  She reports it did not bleed.  She did not have loss of consciousness.  No confusion.  No nausea no vomiting, no visual changes.  Patient reports that she fell backwards and landed on her buttocks.  She reports she has some discomfort right on her tailbone.  She however was able to get up and ambulate with some assistance from her daughter.  She denies having any pain in her hips knees or lower legs.  No weakness numbness or tingling radiating into the legs.  She does identify an area around the SI joint on the right that is uncomfortable.  Reports she was having some discomfort here before she fell and now it seems to be somewhat exacerbated.  No midline spine tenderness.  Patient has had prolonged wait in the emergency department.  Blood pressures have been elevated with occasional reading of systolic 190 patient does not exhibit any signs of endorgan damage.  She does not have chest pain, shortness of breath, blurred vision, confusion.  She reports sometimes her blood pressures are elevated at home but she is compliant with medications.  She was previously on Plavix but it was discontinued 3 weeks ago.    Past Medical History:  Diagnosis Date  . Hypertension     Patient Active Problem List   Diagnosis Date Noted  . NSTEMI (non-ST elevated myocardial infarction) (HCC) 06/05/2019  . Essential hypertension   . Pure hypercholesterolemia   . Asymptomatic cholelithiasis 06/17/2011  . Other and unspecified hyperlipidemia 05/15/2011  . Melanoma in situ (HCC)  05/15/2011  . History of gastroesophageal reflux (GERD) 05/15/2011    Past Surgical History:  Procedure Laterality Date  . CORONARY STENT INTERVENTION N/A 06/07/2019   Procedure: CORONARY STENT INTERVENTION;  Surgeon: Tonny Bollman, MD;  Location: [facility] INVASIVE CV LAB;  Service: Cardiovascular;  Laterality: N/A;  . LEFT HEART CATH AND CORONARY ANGIOGRAPHY N/A 06/07/2019   Procedure: LEFT HEART CATH AND CORONARY ANGIOGRAPHY;  Surgeon: Tonny Bollman, MD;  Location: [facility] INVASIVE CV LAB;  Service: Cardiovascular;  Laterality: N/A;     OB History   No obstetric history on file.     Family History  Problem Relation Age of Onset  . Heart disease Daughter        pacemaker    Social History   Tobacco Use  . Smoking status: Never Smoker  . Smokeless tobacco: Never Used  Substance Use Topics  . Alcohol use: No  . Drug use: No    Home Medications Prior to Admission medications   Medication Sig Start Date End Date Taking? Authorizing Provider  aspirin EC 81 MG tablet Take 81 mg by mouth daily.    [provider]  carvedilol (COREG) 6.25 MG tablet Take 1 tablet (6.25 mg total) by mouth 2 (two) times daily with a meal. 02/07/20 03/08/20  Parke Poisson, MD  clopidogrel (PLAVIX) 75 MG tablet Take 1 tablet (75 mg total) by mouth daily. Stop taking as of December 1st 2021. 02/07/20   Weston Brass  A, MD  losartan (COZAAR) 25 MG tablet Take 1 tablet (25 mg total) by mouth daily. 02/07/20 05/07/20  Parke PoissonAcharya, Gayatri A, MD  nitroGLYCERIN (NITROSTAT) 0.4 MG SL tablet Place 1 tablet (0.4 mg total) under the tongue every 5 (five) minutes as needed for chest pain. 02/07/20   Parke PoissonAcharya, Gayatri A, MD  rosuvastatin (CRESTOR) 20 MG tablet Take 1 tablet (20 mg total) by mouth daily at 6 PM. 02/07/20 03/08/20  Parke PoissonAcharya, Gayatri A, MD    Allergies    Patient has no known allergies.  Review of Systems   Review of Systems 10 systems reviewed and negative except as per HPI Physical Exam Updated Vital  Signs BP (!) 121/91 (BP Location: Right Arm)   Pulse 79   Temp 98.5 F (36.9 C) (Oral)   Resp 14   SpO2 99%   Physical Exam Constitutional:      Appearance: She is well-developed and well-nourished.     Comments: Excellent physical condition for age.  Well-nourished well-developed.  Mental status clear.  No respiratory distress.  HENT:     Head:     Comments: 2-1/2 cm scalp hematoma posterior parietal centrally located.  Some superficial abrasion but no laceration or active bleeding.  No facial trauma.    Nose: Nose normal.     Mouth/Throat:     Mouth: Mucous membranes are moist.     Pharynx: Oropharynx is clear.  Eyes:     Extraocular Movements: Extraocular movements intact and EOM normal.     Pupils: Pupils are equal, round, and reactive to light.  Neck:     Comments: No midline C-spine tenderness. Cardiovascular:     Rate and Rhythm: Normal rate and regular rhythm.     Pulses: Intact distal pulses.     Heart sounds: Normal heart sounds.  Pulmonary:     Effort: Pulmonary effort is normal.     Breath sounds: Normal breath sounds.  Chest:     Chest wall: No tenderness.  Abdominal:     General: Bowel sounds are normal. There is no distension.     Palpations: Abdomen is soft.     Tenderness: There is no abdominal tenderness.  Musculoskeletal:        General: No edema. Normal range of motion.     Cervical back: Neck supple.     Comments: Patient endorses some mild discomfort of the SI joint on the right.  No midline C-spine thoracic or lumbar spine point tenderness over the vertebral bodies.  Normal range of motion bilateral lower extremities.  Patient can perform deep flexion at the hips knees and ankles and push against resistance without pain.  Skin:    General: Skin is warm, dry and intact.  Neurological:     General: No focal deficit present.     Mental Status: She is alert and oriented to person, place, and time.     GCS: GCS eye subscore is 4. GCS verbal subscore is  5. GCS motor subscore is 6.     Cranial Nerves: No cranial nerve deficit.     Motor: No weakness.     Coordination: Coordination normal.     Deep Tendon Reflexes: Strength normal.  Psychiatric:        Mood and Affect: Mood and affect and mood normal.     ED Results / Procedures / Treatments   Labs (all labs ordered are listed, but only abnormal results are displayed) Labs Reviewed - No data to display  EKG None  Radiology CT Head Wo Contrast  Result Date: 06/27/2020 CLINICAL DATA:  Status post fall. EXAM: CT HEAD WITHOUT CONTRAST CT CERVICAL SPINE WITHOUT CONTRAST TECHNIQUE: Multidetector CT imaging of the head and cervical spine was performed following the standard protocol without intravenous contrast. Multiplanar CT image reconstructions of the cervical spine were also generated. COMPARISON:  None. FINDINGS: CT HEAD FINDINGS Brain: Cerebral ventricle sizes are concordant with the degree of cerebral volume loss. Patchy and confluent areas of decreased attenuation are noted throughout the deep and periventricular white matter of the cerebral hemispheres bilaterally, compatible with chronic microvascular ischemic disease. No evidence of large-territorial acute infarction. No parenchymal hemorrhage. No mass lesion. No extra-axial collection. No mass effect or midline shift. No hydrocephalus. Basilar cisterns are patent. Vascular: No hyperdense vessel. Atherosclerotic calcifications are present within the cavernous internal carotid arteries. Skull: No acute fracture or focal lesion. Sinuses/Orbits: Paranasal sinuses and mastoid air cells are clear. The orbits are unremarkable. Other: There is a right parieto-occipital subgaleal hematoma measuring up to 1 cm. CT CERVICAL SPINE FINDINGS Alignment: Grade 1 anterolisthesis of C4 on C5. Skull base and vertebrae: Multilevel degenerative changes of the spine worse at the C5-C6 level with severe intervertebral disc space narrowing, osteophyte formation,  facet arthropathy, uncovertebral arthropathy. At least moderate osseous neural foraminal stenosis the C5-C6 level. No acute fracture. No aggressive appearing focal osseous lesion or focal pathologic process. Soft tissues and spinal canal: No prevertebral fluid or swelling. No visible canal hematoma. Disc levels:  Maintained. Upper chest: Unremarkable. Other: None. IMPRESSION: 1. No acute intracranial abnormality. 2. A 1 cm right parieto-occipital subgaleal hematoma with no underlying calvarial fracture. 3. No acute displaced fracture or traumatic listhesis of the cervical spine. Electronically Signed   By: Tish Frederickson M.D.   On: 06/27/2020 20:38   CT Cervical Spine Wo Contrast  Result Date: 06/27/2020 CLINICAL DATA:  Status post fall. EXAM: CT HEAD WITHOUT CONTRAST CT CERVICAL SPINE WITHOUT CONTRAST TECHNIQUE: Multidetector CT imaging of the head and cervical spine was performed following the standard protocol without intravenous contrast. Multiplanar CT image reconstructions of the cervical spine were also generated. COMPARISON:  None. FINDINGS: CT HEAD FINDINGS Brain: Cerebral ventricle sizes are concordant with the degree of cerebral volume loss. Patchy and confluent areas of decreased attenuation are noted throughout the deep and periventricular white matter of the cerebral hemispheres bilaterally, compatible with chronic microvascular ischemic disease. No evidence of large-territorial acute infarction. No parenchymal hemorrhage. No mass lesion. No extra-axial collection. No mass effect or midline shift. No hydrocephalus. Basilar cisterns are patent. Vascular: No hyperdense vessel. Atherosclerotic calcifications are present within the cavernous internal carotid arteries. Skull: No acute fracture or focal lesion. Sinuses/Orbits: Paranasal sinuses and mastoid air cells are clear. The orbits are unremarkable. Other: There is a right parieto-occipital subgaleal hematoma measuring up to 1 cm. CT CERVICAL  SPINE FINDINGS Alignment: Grade 1 anterolisthesis of C4 on C5. Skull base and vertebrae: Multilevel degenerative changes of the spine worse at the C5-C6 level with severe intervertebral disc space narrowing, osteophyte formation, facet arthropathy, uncovertebral arthropathy. At least moderate osseous neural foraminal stenosis the C5-C6 level. No acute fracture. No aggressive appearing focal osseous lesion or focal pathologic process. Soft tissues and spinal canal: No prevertebral fluid or swelling. No visible canal hematoma. Disc levels:  Maintained. Upper chest: Unremarkable. Other: None. IMPRESSION: 1. No acute intracranial abnormality. 2. A 1 cm right parieto-occipital subgaleal hematoma with no underlying calvarial fracture. 3. No acute displaced fracture or traumatic listhesis of the  cervical spine. Electronically Signed   By: Tish Frederickson M.D.   On: 06/27/2020 20:38    Procedures Procedures (including critical care time)  Medications Ordered in ED Medications  acetaminophen (TYLENOL) tablet 650 mg (has no administration in time range)    ED Course  I have reviewed the triage vital signs and the nursing notes.  Pertinent labs & imaging results that were available during my care of the patient were reviewed by me and considered in my medical decision making (see chart for details).    MDM Rules/Calculators/A&P                          Patient presents as outlined with a fall.  CT shows subgalea hematoma but no intracranial bleed.  Patient's mental status clear.  She had no loss of consciousness.  No other significant associated injury.  At this time return precautions are reviewed.  Patient counseled on use of acetaminophen for back pain or headache.  Patient counseled on monitoring her blood pressures at home.  Patient counseled and discharging instructions include recommendation for increasing carvedilol if blood pressures remain elevated after several measurements at home.  Return  precautions included Final Clinical Impression(s) / ED Diagnoses Final diagnoses:  Injury of head, initial encounter  Hematoma of scalp, initial encounter  Fall in home, initial encounter  Acute right-sided low back pain without sciatica    Rx / DC Orders ED Discharge Orders    None       Arby Barrette, MD 06/28/20 [phone]

## 2020-06-28 NOTE — Discharge Instructions (Signed)
1.  Monitor your blood pressures at home.  If your blood pressures are remaining greater than 150s over 80s, increase your carvedilol dose to 12.5 mg twice daily.  Continue to monitor your heart rate and blood pressure.  Call your cardiologist for recheck on your blood pressure. 2.  Follow head injury instructions.  You may rest and sleep.  Someone should check on you every few hours over the next 48 hours to make sure that you are doing well. 3.  You may take over-the-counter Tylenol (acetaminophen).  This is safe to take with your medications and for people have high blood pressure.  You may take this if you have headache or low back pain. 4.  Return to the emergency department immediately if you have confusion, significantly increasing headache, problems with your vision, weakness numbness or tingling into the arms or the legs or other concerning symptoms.

## 2020-07-19 ENCOUNTER — Ambulatory Visit: Payer: Medicare Other | Admitting: Sports Medicine

## 2020-08-10 ENCOUNTER — Other Ambulatory Visit: Payer: Self-pay

## 2020-08-10 ENCOUNTER — Encounter: Payer: Self-pay | Admitting: Internal Medicine

## 2020-08-10 ENCOUNTER — Ambulatory Visit: Payer: Medicare Other | Admitting: Internal Medicine

## 2020-08-10 VITALS — BP 124/86 | HR 89 | Ht 61.5 in | Wt 138.0 lb

## 2020-08-10 DIAGNOSIS — I10 Essential (primary) hypertension: Secondary | ICD-10-CM | POA: Diagnosis not present

## 2020-08-10 DIAGNOSIS — E7800 Pure hypercholesterolemia, unspecified: Secondary | ICD-10-CM

## 2020-08-10 DIAGNOSIS — I214 Non-ST elevation (NSTEMI) myocardial infarction: Secondary | ICD-10-CM | POA: Diagnosis not present

## 2020-08-10 NOTE — Progress Notes (Signed)
Cardiology Office Note:    Date:  08/10/2020   ID:  Daisy Becker, DOB 06-24-1925, MRN [id number]  PCP:  Pcp, No  Cardiologist:  Parke Poisson, MD  Electrophysiologist:  None   Referring MD: No ref. provider found   Chief Complaint/Reason for Referral: CAD  History of Present Illness:    Daisy Becker is a 87 y.o. female with a history of hypertension who presents today for follow-up of blood pressure, after hospitalization for chest discomfort and dyspnea, resulting in non-STEMI with troponin to 1396.  I participated in the patient's care in the hospital. She underwent coronary angiography with PCI, with angiography notable for severe single-vessel CAD involving the mid RCA treated with a resolute Onyx DES.  Plan is for dual antiplatelet therapy with aspirin and clopidogrel for 12 months.   The patient also had a neurocardiogenic syncopal episode that was witnessed while in hospital.  She has a known history of vasovagal syncope per her report.  She was started on optimal medical therapy for CAD and angina while in hospital, however I discontinued her long-acting nitrate given this episode of neurocardiogenic syncope and lower blood pressures. When she presented for post hospital follow up, her blood pressure was significantly elevated, and we restarted her losartan 25 mg daily.   Joined by her daughter who provides additional history. The patient denies chest pain, chest pressure, dyspnea at rest or with exertion, palpitations, PND, orthopnea, or leg swelling. Denies cough, fever, chills. Denies nausea, vomiting. Denies syncope or presyncope. Denies dizziness or lightheadedness.   Past Medical History:  Diagnosis Date  . Hypertension     Past Surgical History:  Procedure Laterality Date  . CORONARY STENT INTERVENTION N/A 06/07/2019   Procedure: CORONARY STENT INTERVENTION;  Surgeon: Tonny Bollman, MD;  Location: [facility] INVASIVE CV LAB;  Service: Cardiovascular;  Laterality:  N/A;  . LEFT HEART CATH AND CORONARY ANGIOGRAPHY N/A 06/07/2019   Procedure: LEFT HEART CATH AND CORONARY ANGIOGRAPHY;  Surgeon: Tonny Bollman, MD;  Location: [facility] INVASIVE CV LAB;  Service: Cardiovascular;  Laterality: N/A;    Current Medications: Current Meds  Medication Sig  . aspirin EC 81 MG tablet Take 81 mg by mouth daily.  . nitroGLYCERIN (NITROSTAT) 0.4 MG SL tablet Place 1 tablet (0.4 mg total) under the tongue every 5 (five) minutes as needed for chest pain.     Allergies:   Patient has no known allergies.   Social History   Tobacco Use  . Smoking status: Never Smoker  . Smokeless tobacco: Never Used  Substance Use Topics  . Alcohol use: No  . Drug use: No     Family History: The patient's family history includes Heart disease in her daughter.  ROS:   Please see the history of present illness.    All other systems reviewed and are negative.  EKGs/Labs/Other Studies Reviewed:    The following studies were reviewed today:  Recent Labs: No results found for requested labs within last 8760 hours.  Recent Lipid Panel    Component Value Date/Time   CHOL 161 06/06/2019 0358   TRIG 37 06/06/2019 0358   HDL 68 06/06/2019 0358   CHOLHDL 2.4 06/06/2019 0358   VLDL 7 06/06/2019 0358   LDLCALC 86 06/06/2019 0358    Physical Exam:    VS:  BP 124/86   Pulse 89   Ht 5' 1.5" (1.562 m)   Wt 138 lb (62.6 kg)   SpO2 98%   BMI 25.65 kg/m  Wt Readings from Last 5 Encounters:  08/10/20 138 lb (62.6 kg)  02/07/20 143 lb 9.6 oz (65.1 kg)  07/27/19 155 lb (70.3 kg)  06/16/19 151 lb (68.5 kg)  06/08/19 150 lb 3.2 oz (68.1 kg)    Constitutional: No acute distress Eyes: sclera non-icteric, normal conjunctiva and lids ENMT: normal dentition, moist mucous membranes Cardiovascular: regular rhythm, normal rate, no murmurs. S1 and S2 normal. Radial pulses normal bilaterally. No jugular venous distention.  Respiratory: clear to auscultation bilaterally GI : normal  bowel sounds, soft and nontender. No distention.   MSK: extremities warm, well perfused. No edema.  NEURO: grossly nonfocal exam, moves all extremities. PSYCH: alert and oriented x 3, normal mood and affect.   ASSESSMENT:    1. NSTEMI (non-ST elevated myocardial infarction) (HCC)   2. Essential hypertension   3. Pure hypercholesterolemia    PLAN:    NSTEMI-status post PCI to the mid RCA.  Medical therapy includes DAPT x 12 mo. Plavix stopped 06/06/20 - continue ASA, BB, statin.    HTN - BP with reasonable control, continue coreg 6.25 mg BID, losartan 25 mg daily.    HLD - continue crestor.   Total time of encounter: 30 minutes total time of encounter, including 20 minutes spent in face-to-face patient care on the date of this encounter. This time includes coordination of care and counseling regarding above mentioned problem list. Remainder of non-face-to-face time involved reviewing chart documents/testing relevant to the patient encounter and documentation in the medical record. I have independently reviewed documentation from referring provider.   Weston Brass, MD Export  [facility]    Medication Adjustments/Labs and Tests Ordered: Current medicines are reviewed at length with the patient today.  Concerns regarding medicines are outlined above.   No orders of the defined types were placed in this encounter.   No orders of the defined types were placed in this encounter.   Patient Instructions  Medication Instructions:  No Changes In Medications at this time.  *If you need a refill on your cardiac medications before your next appointment, please call your pharmacy*  Follow-Up: At [facility], you and your health needs are our priority.  As part of our continuing mission to provide you with exceptional heart care, we have created designated Provider Care Teams.  These Care Teams include your primary Cardiologist (physician) and Advanced Practice Providers  (APPs -  Physician Assistants and Nurse Practitioners) who all work together to provide you with the care you need, when you need it.  Your next appointment:   CALL us IF YOU NEED [place]   The format for your next appointment:   In Person  Provider:   Weston Brass, MD

## 2020-08-10 NOTE — Patient Instructions (Signed)
Medication Instructions:  No Changes In Medications at this time.  *If you need a refill on your cardiac medications before your next appointment, please call your pharmacy*  Follow-Up: At [facility], you and your health needs are our priority.  As part of our continuing mission to provide you with exceptional heart care, we have created designated Provider Care Teams.  These Care Teams include your primary Cardiologist (physician) and Advanced Practice Providers (APPs -  Physician Assistants and Nurse Practitioners) who all work together to provide you with the care you need, when you need it.  Your next appointment:   CALL us IF YOU NEED [place]   The format for your next appointment:   In Person  Provider:   Weston Brass, MD

## 2020-08-13 ENCOUNTER — Ambulatory Visit: Payer: Medicare Other | Admitting: Internal Medicine

## 2020-09-06 ENCOUNTER — Other Ambulatory Visit: Payer: Self-pay

## 2020-09-06 ENCOUNTER — Ambulatory Visit: Payer: Medicare Other | Admitting: Sports Medicine

## 2020-09-06 ENCOUNTER — Encounter: Payer: Self-pay | Admitting: Sports Medicine

## 2020-09-06 DIAGNOSIS — M79675 Pain in left toe(s): Secondary | ICD-10-CM | POA: Diagnosis not present

## 2020-09-06 DIAGNOSIS — M79674 Pain in right toe(s): Secondary | ICD-10-CM

## 2020-09-06 DIAGNOSIS — I739 Peripheral vascular disease, unspecified: Secondary | ICD-10-CM

## 2020-09-06 DIAGNOSIS — Z7901 Long term (current) use of anticoagulants: Secondary | ICD-10-CM

## 2020-09-06 DIAGNOSIS — B351 Tinea unguium: Secondary | ICD-10-CM

## 2020-09-06 NOTE — Progress Notes (Signed)
Subjective: Daisy Becker is a 87 y.o. female patient seen today in office with complaint of mildly painful thickened and elongated toenails; unable to trim.  Reports that she is still on Plavix like before.  Denies any changes with medication.  No changes with health history since last encounter except history of a fall while using a vaccum.  Patient is assisted by daughter this visit.  Patient Active Problem List   Diagnosis Date Noted  . NSTEMI (non-ST elevated myocardial infarction) (HCC) 06/05/2019  . Essential hypertension   . Pure hypercholesterolemia   . Asymptomatic cholelithiasis 06/17/2011  . Other and unspecified hyperlipidemia 05/15/2011  . Melanoma in situ (HCC) 05/15/2011  . History of gastroesophageal reflux (GERD) 05/15/2011    Current Outpatient Medications on File Prior to Visit  Medication Sig Dispense Refill  . aspirin EC 81 MG tablet Take 81 mg by mouth daily.    . carvedilol (COREG) 6.25 MG tablet Take 1 tablet (6.25 mg total) by mouth 2 (two) times daily with a meal. 180 tablet 3  . losartan (COZAAR) 25 MG tablet Take 1 tablet (25 mg total) by mouth daily. 90 tablet 3  . nitroGLYCERIN (NITROSTAT) 0.4 MG SL tablet Place 1 tablet (0.4 mg total) under the tongue every 5 (five) minutes as needed for chest pain. 25 tablet 3  . rosuvastatin (CRESTOR) 20 MG tablet Take 1 tablet (20 mg total) by mouth daily at 6 PM. 90 tablet 3   No current facility-administered medications on file prior to visit.    No Known Allergies  Objective: Physical Exam  General: Well developed, nourished, no acute distress, awake, alert and oriented x 3  Vascular: Dorsalis pedis artery 1/4 bilateral, Posterior tibial artery 0/4 bilateral, skin temperature warm to warm proximal to distal bilateral lower extremities, moderate varicosities, trace edema to ankles, scant pedal hair present bilateral.  Neurological: Gross sensation present via light touch bilateral.   Dermatological: Skin  is warm, dry, and supple bilateral, Nails 1-10 are tender, long, thick, and discolored with mild subungal debris, no webspace macerations present bilateral, no open lesions present bilateral, no callus/corns/hyperkeratotic tissue present bilateral. No signs of infection bilateral.  Musculoskeletal: Asymptomatic boney hammertoe and bunion deformities noted bilateral.  Pes planus foot type bilateral.  Muscular strength within normal limits without painon range of motion acceptable for patient status. No pain with calf compression bilateral.  Assessment and Plan:  Problem List Items Addressed This Visit   None   Visit Diagnoses    Pain due to onychomycosis of toenails of both feet    -  Primary   Current use of anticoagulant therapy       PVD (peripheral vascular disease) (HCC)         -Examined patient.  -Re-Discussed treatment options for painful mycotic nails. -Mechanically debrided and reduced mycotic nails with sterile nail nipper and dremel nail file without incident. -Encouraged to continue with elevation and compression garments for edema control like previous -Advised walker if needed for stability  -Patient to return in 3 months for follow up evaluation or sooner if symptoms worsen.  Asencion Islam, DPM

## 2021-01-24 ENCOUNTER — Ambulatory Visit: Payer: Medicare Other | Admitting: Sports Medicine

## 2021-02-14 ENCOUNTER — Ambulatory Visit: Payer: Medicare Other | Admitting: Sports Medicine

## 2021-03-05 ENCOUNTER — Other Ambulatory Visit: Payer: Self-pay

## 2021-03-05 ENCOUNTER — Encounter: Payer: Self-pay | Admitting: Physician Assistant

## 2021-03-05 ENCOUNTER — Ambulatory Visit: Payer: Medicare Other | Admitting: Physician Assistant

## 2021-03-05 VITALS — BP 140/80 | HR 60 | Ht 62.0 in | Wt 141.2 lb

## 2021-03-05 DIAGNOSIS — R55 Syncope and collapse: Secondary | ICD-10-CM

## 2021-03-05 DIAGNOSIS — E785 Hyperlipidemia, unspecified: Secondary | ICD-10-CM | POA: Diagnosis not present

## 2021-03-05 DIAGNOSIS — I10 Essential (primary) hypertension: Secondary | ICD-10-CM | POA: Diagnosis not present

## 2021-03-05 DIAGNOSIS — I2510 Atherosclerotic heart disease of native coronary artery without angina pectoris: Secondary | ICD-10-CM | POA: Diagnosis not present

## 2021-03-05 MED ORDER — CARVEDILOL 6.25 MG PO TABS: 6.2500 mg | ORAL_TABLET | Freq: Two times a day (BID) | ORAL | 3 refills | Status: DC

## 2021-03-05 MED ORDER — ROSUVASTATIN CALCIUM 20 MG PO TABS: 20.0000 mg | ORAL_TABLET | Freq: Every day | ORAL | 3 refills | Status: DC

## 2021-03-05 MED ORDER — LOSARTAN POTASSIUM 25 MG PO TABS: 25.0000 mg | ORAL_TABLET | Freq: Every day | ORAL | 3 refills | Status: DC

## 2021-03-05 NOTE — Patient Instructions (Signed)
Medication Instructions:  Your physician recommends that you continue on your current medications as directed. Please refer to the Current Medication list given to you today.  *If you need a refill on your cardiac medications before your next appointment, please call your pharmacy*  Lab Work: NONE ordered at this time of appointment   If you have labs (blood work) drawn today and your tests are completely normal, you will receive your results only by: MyChart Message (if you have MyChart) OR A paper copy in the mail If you have any lab test that is abnormal or we need to change your treatment, we will call you to review the results.  Testing/Procedures: NONE ordered at this time of appointment   Follow-Up: At [facility], you and your health needs are our priority.  As part of our continuing mission to provide you with exceptional heart care, we have created designated Provider Care Teams.  These Care Teams include your primary Cardiologist (physician) and Advanced Practice Providers (APPs -  Physician Assistants and Nurse Practitioners) who all work together to provide you with the care you need, when you need it.  Your next appointment:   6 month(s)  The format for your next appointment:   In Person  Provider:   Dr. Erick Colace  Other Instructions

## 2021-03-05 NOTE — Progress Notes (Signed)
Cardiology Office Note:    Date:  03/05/2021   ID:  Daisy Becker, DOB 02/22/1925, MRN [id number]  PCP:  Cherylann Ratel, MD   [place] [facility] Cardiologist:  Elouise Munroe, MD     Referring MD: No ref. provider found   Chief Complaint  Patient presents with   Follow-up    6 month follow up, seen for Dr. Margaretann Loveless. Plan to establish with Dr. Egbert Garibaldi in [place]      History of Present Illness:    Daisy Becker is a 87 y.o. female with a hx of HTN, HLD and CAD.  Patient was admitted with NSTEMI in November 2020.  Echocardiogram obtained on 06/05/2019 showed EF 60 to 65%, grade 1 DD, trace MR, mild TR, trivial AI.  Cardiac catheterization obtained on 06/07/2019 showed a severe single-vessel CAD involving mid RCA treated with 2.25 x 18 mm resolute Onyx DES, 50% proximal LAD, 50% mid LAD, 50% proximal to mid left circumflex lesion, 40% D1 lesion, 50% proximal RCA lesion were managed medically.  Postprocedure, she was treated with aspirin and Plavix for 12 months.  She also had a history of neurocardiogenic syncope witnessed while in the hospital.  She has a known history of vasovagal syncope per her report.  Her long-acting nitrate was discontinued due to the episode of syncope in the low blood pressure.  Patient was last seen by Dr. Margaretann Loveless in February 2022 accompanied by her daughter at which time she was doing well.  Patient presents today for follow-up.  Overall, she has been doing very well without any exertional chest pain or worsening dyspnea.  She does have some balance issue however otherwise very much independent for a 87 year old.  She describe occasional chest tingling sensation, however those spells are quite transient and rare and does not associated with physical activity.  Her EKG shows no significant ischemia.  She is on appropriate medication include aspirin, carvedilol, losartan and Crestor.  Most recent blood work obtained in July at her PCPs office showed very  well-controlled cholesterol including LDL.  Overall, she is doing very well from the cardiac perspective and can follow-up in 6 months.  She is in the process of moving back to [place] and was going to establish with Dr. Egbert Garibaldi of [facility].  However, if in the future she is in [place] staying with her daughter again, if she has any new issues, she would certainly be welcome to contact us.  Past Medical History:  Diagnosis Date   CAD (coronary artery disease)    s/p mRCA DES 06/07/2019, 50% prox RCA, prox LCx, prox LAD and mid LAD residual, 40% D1.   Hyperlipidemia LDL goal <70    Hypertension     Past Surgical History:  Procedure Laterality Date   CORONARY STENT INTERVENTION N/A 06/07/2019   Procedure: CORONARY STENT INTERVENTION;  Surgeon: Sherren Mocha, MD;  Location: [place] [facility];  Service: Cardiovascular;  Laterality: N/A;   LEFT HEART CATH AND CORONARY ANGIOGRAPHY N/A 06/07/2019   Procedure: LEFT HEART CATH AND CORONARY ANGIOGRAPHY;  Surgeon: Sherren Mocha, MD;  Location: [place] [facility];  Service: Cardiovascular;  Laterality: N/A;    Current Medications: Current Meds  Medication Sig   aspirin EC 81 MG tablet Take 81 mg by mouth daily.   nitroGLYCERIN (NITROSTAT) 0.4 MG SL tablet Place 1 tablet (0.4 mg total) under the tongue every 5 (five) minutes as needed for chest pain.     Allergies:   Patient has no known allergies.  Social History   Socioeconomic History   Marital status: Widowed    Spouse name: Not on file   Number of children: Not on file   Years of education: Not on file   Highest education level: Not on file  Occupational History   Not on file  Tobacco Use   Smoking status: Never   Smokeless tobacco: Never  Substance and Sexual Activity   Alcohol use: No   Drug use: No   Sexual activity: Never  Other Topics Concern   Not on file  Social History Narrative   Not on file   Social Determinants of Health   Financial Resource Strain:  Not on file  Food Insecurity: Not on file  Transportation Needs: Not on file  Physical Activity: Not on file  Stress: Not on file  Social Connections: Not on file     Family History: The patient's family history includes Heart disease in her daughter.  ROS:   Please see the history of present illness.     All other systems reviewed and are negative.  EKGs/Labs/Other Studies Reviewed:    The following studies were reviewed today:  Echo 06/05/2019 IMPRESSIONS    1. Left ventricular ejection fraction, by visual estimation, is 60 to  65%. The left ventricle has normal function. There is no left ventricular hypertrophy.   2. Left ventricular diastolic parameters are consistent with Grade I diastolic dysfunction (impaired relaxation).   3. Global right ventricle has normal systolic function.The right  ventricular size is normal. No increase in right ventricular wall  thickness.   4. Left atrial size was normal.   5. Right atrial size was normal.   6. The mitral valve is normal in structure. Trace mitral valve  regurgitation. No evidence of mitral stenosis.   7. The tricuspid valve is normal in structure. Tricuspid valve  regurgitation is mild.   8. The aortic valve is tricuspid. Aortic valve regurgitation is trivial.  Mild aortic valve sclerosis without stenosis.   9. The pulmonic valve was normal in structure. Pulmonic valve  regurgitation is not visualized.  10. Normal pulmonary artery systolic pressure.  11. The inferior vena cava is dilated in size with >50% respiratory  variability, suggesting right atrial pressure of 8 mmHg.     Cath 06/07/2019 1. 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 Moderate nonobstructive LAD and LCx stenoses 3. Normal LV function by echo   Recommend: DAPT with ASA and clopidogrel x 12 months, OK for DC tomorrow am if no complications arise.   Diagnostic Dominance: Right Left Anterior  Descending  Prox LAD to Mid LAD lesion is 50% stenosed. The lesion is moderately calcified.  Mid LAD to Dist LAD lesion is 50% stenosed.  First Diagonal Branch  1st Diag lesion is 40% stenosed.  Left Circumflex  Prox Cx to Mid Cx lesion is 50% stenosed. The lesion is focal.  Right Coronary Artery  Prox RCA lesion is 50% stenosed. The lesion is mildly calcified.  Prox RCA to Mid RCA lesion is 90% stenosed. The lesion is mildly calcified.  Intervention  Prox RCA to Mid RCA lesion  Stent  CATHETER LAUNCHER 9F JR4 guide catheter was inserted. Lesion crossed with guidewire using a WIRE COUGAR XT STRL 190CM. Pre-stent angioplasty was performed using a BALLOON SAPPHIRE [place] 2.25X15. Maximum pressure: 12 atm. A drug-eluting stent was successfully placed using a STENT RESOLUTE ONYX 2.25X18. Post-stent angioplasty was performed using a BALLOON SAPPHIRE   2.5X12. Maximum pressure: 16 atm.  Post-Intervention Lesion Assessment  The intervention was successful. Pre-interventional TIMI flow is 3. Post-intervention TIMI flow is 3. No complications occurred at this lesion.  There is a 0% residual stenosis post intervention.   Diagnostic Dominance: Right Intervention     EKG:  EKG is ordered today.  The ekg ordered today demonstrates normal sinus rhythm, no significant ST-T wave changes.  Poor R wave question in the anterior leads.  Recent Labs: No results found for requested labs within last 8760 hours.  Recent Lipid Panel    Component Value Date/Time   CHOL 161 06/06/2019 0358   TRIG 37 06/06/2019 0358   HDL 68 06/06/2019 0358   CHOLHDL 2.4 06/06/2019 0358   VLDL 7 06/06/2019 0358   LDLCALC 86 06/06/2019 0358     Risk Assessment/Calculations:           Physical Exam:    VS:  BP 140/80 (BP Location: Right Arm)   Pulse 60   Ht '5\' 2"'  (1.575 m)   Wt 141 lb 3.2 oz (64 kg)   SpO2 99%   BMI 25.83 kg/m     Wt Readings from Last 3 Encounters:  03/05/21 141 lb 3.2 oz (64 kg)  08/10/20  138 lb (62.6 kg)  02/07/20 143 lb 9.6 oz (65.1 kg)     GEN:  Well nourished, well developed in no acute distress HEENT: Normal NECK: No JVD; No carotid bruits LYMPHATICS: No lymphadenopathy CARDIAC: RRR, no murmurs, rubs, gallops RESPIRATORY:  Clear to auscultation without rales, wheezing or rhonchi  ABDOMEN: Soft, non-tender, non-distended MUSCULOSKELETAL:  No edema; No deformity  SKIN: Warm and dry NEUROLOGIC:  Alert and oriented x 3 PSYCHIATRIC:  Normal affect   ASSESSMENT:    1. Coronary artery disease involving native coronary artery of native heart without angina pectoris   2. Essential hypertension   3. Hyperlipidemia LDL goal <70   4. Syncope, unspecified syncope type    PLAN:    In order of problems listed above:  CAD: Continue aspirin and statin.  Denies any recent exertional chest pain.  She is functionally independent for 87 year old.  She does have 50% residual disease in proximal RCA, proximal to mid LAD and proximal left circumflex based on her previous cardiac catheterization in December 2020.  Hypertension: Blood pressure borderline elevated, however patient has a history of vasovagal syncope, I think her current blood pressure is appropriate.  Hyperlipidemia: Continue Crestor 20 mg daily.  Recent blood work obtained in July 2022 showed very well-controlled cholesterol.  LDL at goal less than 70.  Syncope: She had a history of syncope related to low blood pressure.  She has not had any recurrence of syncope in the past year.  We will keep blood pressure borderline high.        Medication Adjustments/Labs and Tests Ordered: Current medicines are reviewed at length with the patient today.  Concerns regarding medicines are outlined above.  Orders Placed This Encounter  Procedures   EKG 12-Lead   Meds ordered this encounter  Medications   rosuvastatin (CRESTOR) 20 MG tablet    Sig: Take 1 tablet (20 mg total) by mouth daily at 6 PM.    Dispense:  90  tablet    Refill:  3   carvedilol (COREG) 6.25 MG tablet    Sig: Take 1 tablet (6.25 mg total) by mouth 2 (two) times daily with a meal.    Dispense:  180 tablet    Refill:  3   losartan (COZAAR) 25 MG tablet    Sig: Take 1 tablet (25 mg total) by mouth daily.    Dispense:  90 tablet    Refill:  3    Patient Instructions  Medication Instructions:  Your physician recommends that you continue on your current medications as directed. Please refer to the Current Medication list given to you today.  *If you need a refill on your cardiac medications before your next appointment, please call your pharmacy*  Lab Work: NONE ordered at this time of appointment   If you have labs (blood work) drawn today and your tests are completely normal, you will receive your results only by: [place] (if you have MyChart) OR A paper copy in the mail If you have any lab test that is abnormal or we need to change your treatment, we will call you to review the results.  Testing/Procedures: NONE ordered at this time of appointment   Follow-Up: At [facility], you and your health needs are our priority.  As part of our continuing mission to provide you with exceptional heart care, we have created designated Provider Care Teams.  These Care Teams include your primary Cardiologist (physician) and Advanced Practice Providers (APPs -  Physician Assistants and Nurse Practitioners) who all work together to provide you with the care you need, when you need it.  Your next appointment:   6 month(s)  The format for your next appointment:   In Person  Provider:   Dr. Courtney Heys  Other Instructions    Signed, Almyra Deforest, [place]  03/05/2021 2:29 PM    [place]

## 2021-04-11 ENCOUNTER — Encounter: Payer: Self-pay | Admitting: Sports Medicine

## 2021-04-11 ENCOUNTER — Ambulatory Visit: Payer: Medicare Other | Admitting: Sports Medicine

## 2021-04-11 ENCOUNTER — Other Ambulatory Visit: Payer: Self-pay

## 2021-04-11 DIAGNOSIS — M79674 Pain in right toe(s): Secondary | ICD-10-CM | POA: Diagnosis not present

## 2021-04-11 DIAGNOSIS — M79675 Pain in left toe(s): Secondary | ICD-10-CM

## 2021-04-11 DIAGNOSIS — I739 Peripheral vascular disease, unspecified: Secondary | ICD-10-CM

## 2021-04-11 DIAGNOSIS — Z7901 Long term (current) use of anticoagulants: Secondary | ICD-10-CM

## 2021-04-11 DIAGNOSIS — B351 Tinea unguium: Secondary | ICD-10-CM | POA: Diagnosis not present

## 2021-04-11 NOTE — Progress Notes (Signed)
Subjective: Daisy Becker is a 87 y.o. female patient seen today in office with complaint of mildly painful thickened and elongated toenails; unable to trim.  Reports that she is still on Plavix like before.  No changes with medication or health history since last encounter.  Patient is assisted by daughter this visit.  Patient Active Problem List   Diagnosis Date Noted   NSTEMI (non-ST elevated myocardial infarction) (HCC) 06/05/2019   Essential hypertension    Pure hypercholesterolemia    Asymptomatic cholelithiasis 06/17/2011   Other and unspecified hyperlipidemia 05/15/2011   Melanoma in situ (HCC) 05/15/2011   History of gastroesophageal reflux (GERD) 05/15/2011    Current Outpatient Medications on File Prior to Visit  Medication Sig Dispense Refill   aspirin EC 81 MG tablet Take 81 mg by mouth daily.     losartan (COZAAR) 25 MG tablet Take 1 tablet (25 mg total) by mouth daily. 90 tablet 3   nitroGLYCERIN (NITROSTAT) 0.4 MG SL tablet Place 1 tablet (0.4 mg total) under the tongue every 5 (five) minutes as needed for chest pain. 25 tablet 3   carvedilol (COREG) 6.25 MG tablet Take 1 tablet (6.25 mg total) by mouth 2 (two) times daily with a meal. 180 tablet 3   rosuvastatin (CRESTOR) 20 MG tablet Take 1 tablet (20 mg total) by mouth daily at 6 PM. 90 tablet 3   No current facility-administered medications on file prior to visit.    No Known Allergies  Objective: Physical Exam  General: Well developed, nourished, no acute distress, awake, alert and oriented x 3  Vascular: Dorsalis pedis artery 1/4 bilateral, Posterior tibial artery 0/4 bilateral, skin temperature warm to warm proximal to distal bilateral lower extremities, moderate varicosities, trace edema to ankles, scant pedal hair present bilateral.  Neurological: Gross sensation present via light touch bilateral.   Dermatological: Skin is warm, dry, and supple bilateral, Nails 1-10 are tender, long, thick, and  discolored with mild subungal debris, no webspace macerations present bilateral, no open lesions present bilateral, no callus/corns/hyperkeratotic tissue present bilateral. No signs of infection bilateral.  Musculoskeletal: Asymptomatic boney hammertoe and bunion deformities noted bilateral.  Pes planus foot type bilateral.  Muscular strength within normal limits without painon range of motion acceptable for patient status. No pain with calf compression bilateral.  Assessment and Plan:  Problem List Items Addressed This Visit   None Visit Diagnoses     Pain due to onychomycosis of toenails of both feet    -  Primary   Current use of anticoagulant therapy       PVD (peripheral vascular disease) (HCC)          -Examined patient.  -Re-Discussed treatment options for painful mycotic nails. -Mechanically debrided and reduced mycotic nails with sterile nail nipper in length and girth and use Dremel to smooth nails without incident. -Encouraged elevation of legs to assist with edema control -Patient to return in 3 months for follow up evaluation or sooner if symptoms worsen.  Asencion Islam, DPM

## 2021-04-22 ENCOUNTER — Other Ambulatory Visit: Payer: Self-pay | Admitting: Internal Medicine

## 2022-01-08 IMAGING — CT CT HEAD W/O CM
4 series · 15 of 47 positions shown, 17 images · non-contrast
Comparison: None.

CLINICAL DATA: Status post fall.

EXAM:
CT HEAD WITHOUT CONTRAST
CT CERVICAL SPINE WITHOUT CONTRAST
TECHNIQUE: Multidetector CT imaging of the head and cervical spine was
performed following the standard protocol without intravenous
contrast. Multiplanar CT image reconstructions of the cervical spine
were also generated.

[Series 3: head without · axial · non-contrast · 0.42mm/px · z∈[-82,+38]mm · 7 of 33 slices shown, 9 images]
[im 5/33  brain]
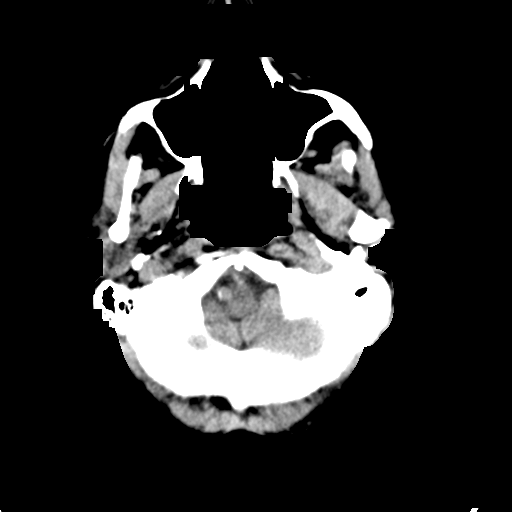
[im 5/33  bone]
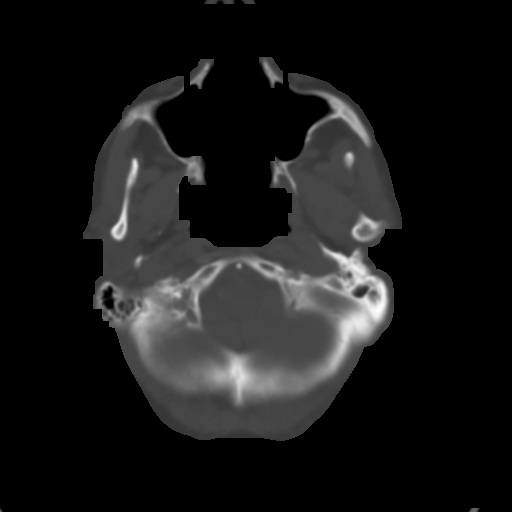
[im 9/33  brain]
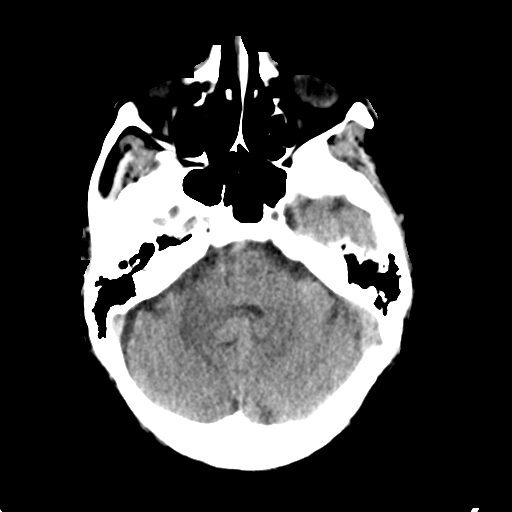
[im 13/33  brain]
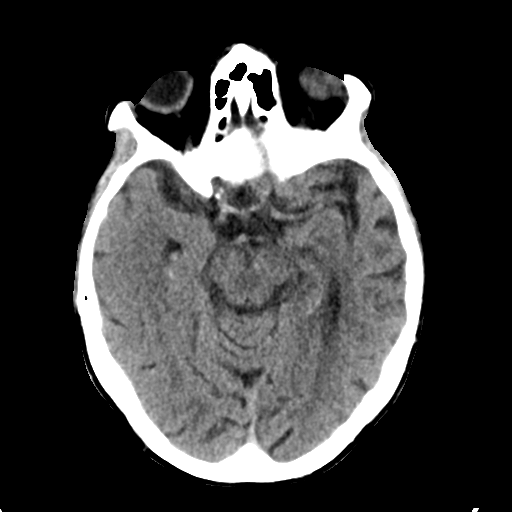
[im 17/33  brain]
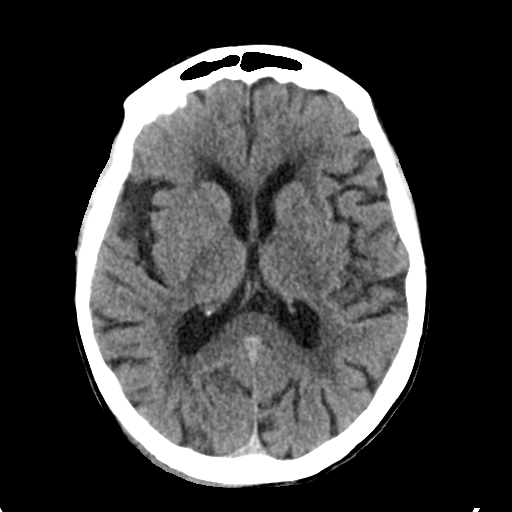
[im 21/33  brain]
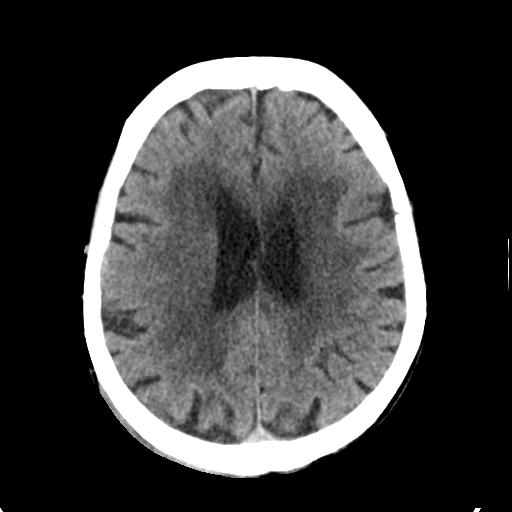
[im 21/33  bone]
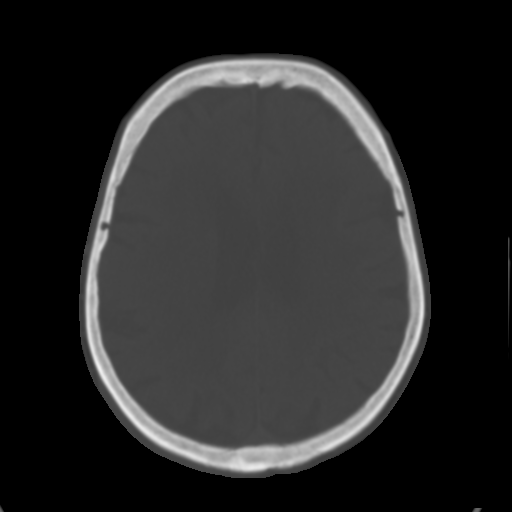
[im 25/33  brain]
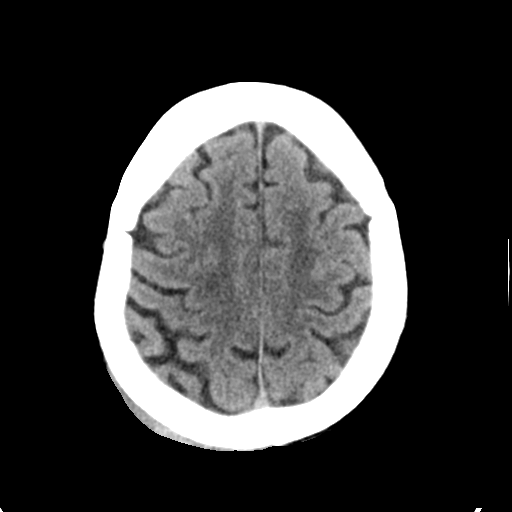
[im 29/33  brain]
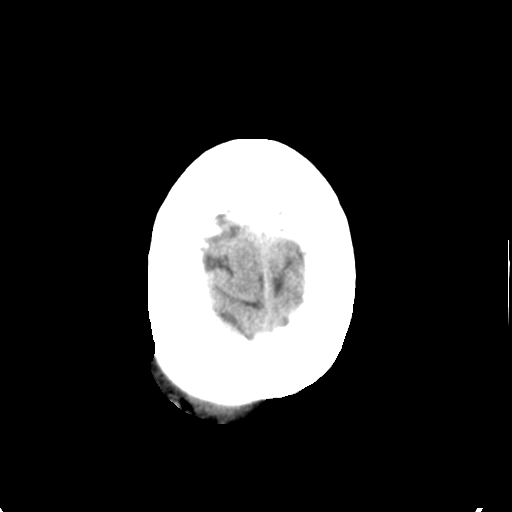

[Series 4: head bone · axial · 0.42mm/px · z∈[-86,-70]mm · 2 of 82 slices shown]
[im 9/82  bone]
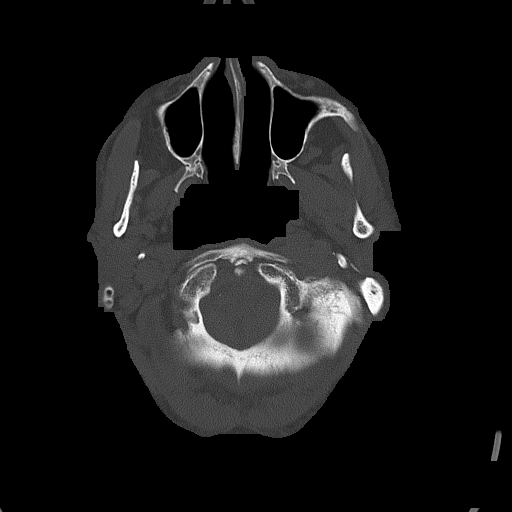
[im 17/82  bone]
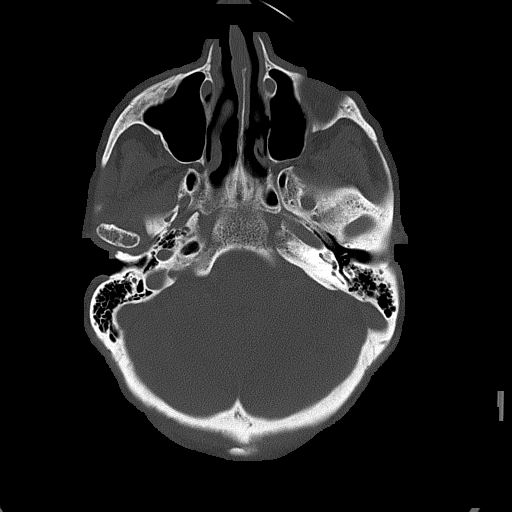

[Series 5: head without cor · coronal · non-contrast · 0.32mm/px · 3 of 69 slices shown]
[im 23/69  brain]
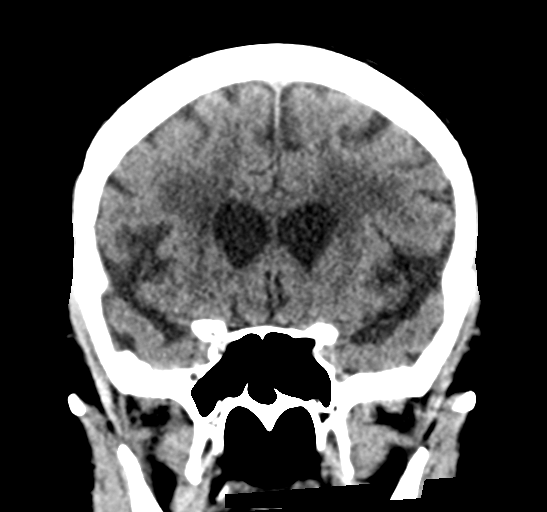
[im 31/69  brain]
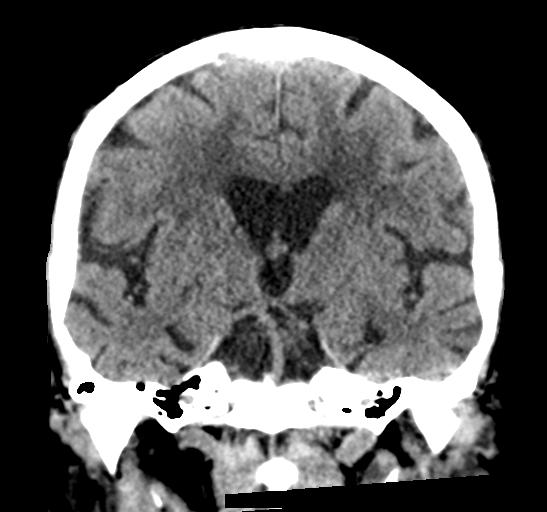
[im 38/69  brain]
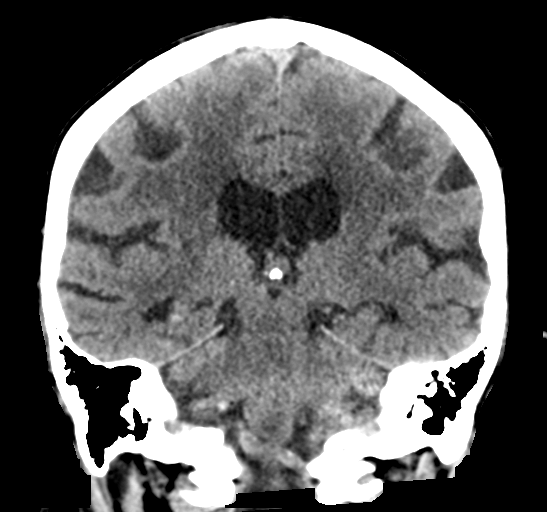

[Series 6: head without sag · sagittal · non-contrast · 0.32mm/px · 3 of 59 slices shown]
[im 20/59  brain]
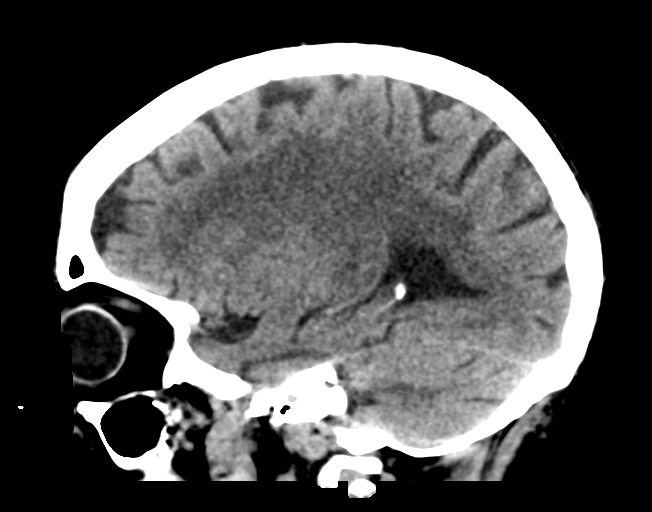
[im 30/59  brain]
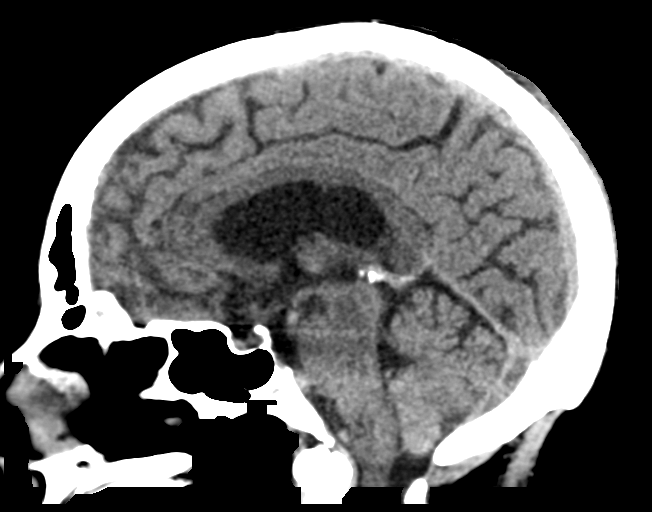
[im 39/59  brain]
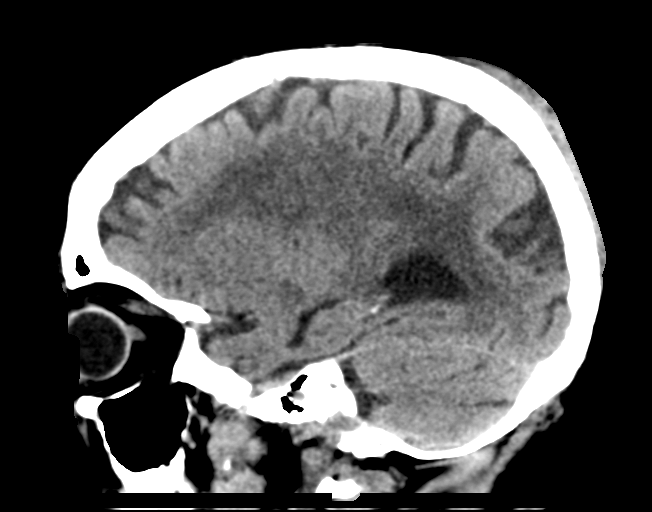

[15 of 47 positions shown; findings below may reference images not displayed]

FINDINGS: CT HEAD FINDINGS

Brain:

Cerebral ventricle sizes are concordant with the degree of cerebral
volume loss.

Patchy and confluent areas of decreased attenuation are noted
throughout the deep and periventricular white matter of the cerebral
hemispheres bilaterally, compatible with chronic microvascular
ischemic disease.

No evidence of large-territorial acute infarction. No parenchymal
hemorrhage. No mass lesion. No extra-axial collection.

No mass effect or midline shift. No hydrocephalus. Basilar cisterns
are patent.

Vascular: No hyperdense vessel. Atherosclerotic calcifications are
present within the cavernous internal carotid arteries.

Skull: No acute fracture or focal lesion.

Sinuses/Orbits: Paranasal sinuses and mastoid air cells are clear.
The orbits are unremarkable.

Other: There is a right parieto-occipital subgaleal hematoma
measuring up to 1 cm.

CT CERVICAL SPINE FINDINGS

Alignment: Grade 1 anterolisthesis of C4 on C5.

Skull base and vertebrae: Multilevel degenerative changes of the
spine worse at the C5-C6 level with severe intervertebral disc space
narrowing, osteophyte formation, facet arthropathy, uncovertebral
arthropathy. At least moderate osseous neural foraminal stenosis the
C5-C6 level. No acute fracture. No aggressive appearing focal
osseous lesion or focal pathologic process.

Soft tissues and spinal canal: No prevertebral fluid or swelling. No
visible canal hematoma.

Disc levels:  Maintained.

Upper chest: Unremarkable.

Other: None.
IMPRESSION: 1. No acute intracranial abnormality.
2. A 1 cm right parieto-occipital subgaleal hematoma with no
underlying calvarial fracture.
3. No acute displaced fracture or traumatic listhesis of the
cervical spine.

## 2022-01-10 ENCOUNTER — Other Ambulatory Visit: Payer: Self-pay | Admitting: Internal Medicine

## 2022-10-15 ENCOUNTER — Telehealth: Payer: Self-pay | Admitting: Internal Medicine

## 2022-10-15 NOTE — Telephone Encounter (Signed)
*  STAT* If patient is at the pharmacy, call can be transferred to refill team.   1. Which medications need to be refilled? (please list name of each medication and dose if known) nitroGLYCERIN (NITROSTAT) 0.4 MG SL tablet   2. Which pharmacy/location (including street and city if local pharmacy) is medication to be sent to?   [id number] - [place], [place] - [address] Phone: [phone]  Fax: [phone]    3. Do they need a 30 day or 90 day supply? 30     Pt daughter states pt pills expired this past October. I asked if pt was having any chest pain she said no but she does have arm pain every now and then that bothers her. Pt is still scheduled for 11/03/22 and was added to waitlist.

## 2022-10-17 MED ORDER — NITROGLYCERIN 0.4 MG SL SUBL: 0.4000 mg | SUBLINGUAL_TABLET | SUBLINGUAL | 1 refills | Status: DC | PRN

## 2022-10-17 NOTE — Telephone Encounter (Signed)
Refill sent.

## 2022-11-03 ENCOUNTER — Encounter: Payer: Self-pay | Admitting: Internal Medicine

## 2022-11-03 ENCOUNTER — Ambulatory Visit: Payer: Medicare Other | Attending: Internal Medicine | Admitting: Internal Medicine

## 2022-11-03 VITALS — BP 132/84 | HR 71 | Ht 61.0 in | Wt 147.4 lb

## 2022-11-03 DIAGNOSIS — Z79899 Other long term (current) drug therapy: Secondary | ICD-10-CM

## 2022-11-03 DIAGNOSIS — R55 Syncope and collapse: Secondary | ICD-10-CM | POA: Diagnosis not present

## 2022-11-03 DIAGNOSIS — E785 Hyperlipidemia, unspecified: Secondary | ICD-10-CM | POA: Diagnosis not present

## 2022-11-03 DIAGNOSIS — I10 Essential (primary) hypertension: Secondary | ICD-10-CM

## 2022-11-03 DIAGNOSIS — I2510 Atherosclerotic heart disease of native coronary artery without angina pectoris: Secondary | ICD-10-CM

## 2022-11-03 DIAGNOSIS — I214 Non-ST elevation (NSTEMI) myocardial infarction: Secondary | ICD-10-CM

## 2022-11-03 MED ORDER — ROSUVASTATIN CALCIUM 20 MG PO TABS: 20.0000 mg | ORAL_TABLET | Freq: Every day | ORAL | 3 refills | Status: AC

## 2022-11-03 MED ORDER — CARVEDILOL 6.25 MG PO TABS: 6.2500 mg | ORAL_TABLET | Freq: Two times a day (BID) | ORAL | 3 refills | Status: AC

## 2022-11-03 NOTE — Progress Notes (Signed)
Cardiology Office Note:    Date:  11/03/2022   ID:  Daisy Becker, DOB September 03, 1924, MRN [id number]  PCP:  Clide Cliff, MD  Cardiologist:  Parke Poisson, MD  Electrophysiologist:  None   Referring MD: Clide Cliff, MD   Chief Complaint/Reason for Referral: CAD  History of Present Illness:    Daisy Becker is a 89 y.o. female with a history of hypertension who presents today for follow-up of blood pressure, after hospitalization for chest discomfort and dyspnea, resulting in non-STEMI with troponin to 1396.  I participated in the patient's care in the hospital. She underwent coronary angiography with PCI, with angiography notable for severe single-vessel CAD involving the mid RCA treated with a resolute Onyx DES.  Plan is for dual antiplatelet therapy with aspirin and clopidogrel for 12 months.   The patient also had a neurocardiogenic syncopal episode that was witnessed while in hospital.  She has a known history of vasovagal syncope per her report.  She was started on optimal medical therapy for CAD and angina while in hospital, however I discontinued her long-acting nitrate given this episode of neurocardiogenic syncope and lower blood pressures. When she presented for post hospital follow up, her blood pressure was significantly elevated, and we restarted her losartan 25 mg daily.   Joined by her daughter who provides additional history. The patient denies chest pain, chest pressure, dyspnea at rest or with exertion, palpitations, PND, orthopnea, or leg swelling. Denies cough, fever, chills. Denies nausea, vomiting. Denies syncope or presyncope. Denies dizziness or lightheadedness.   Doing very well overall.  Occasional left arm numbness which may be contributed to by arthritis.  Rare twinges in her chest.  No recurrent syncope.  Past Medical History:  Diagnosis Date   CAD (coronary artery disease)    s/p mRCA DES 06/07/2019, 50% prox RCA, prox LCx, prox LAD and mid LAD  residual, 40% D1.   Hyperlipidemia LDL goal <70    Hypertension     Past Surgical History:  Procedure Laterality Date   CORONARY STENT INTERVENTION N/A 06/07/2019   Procedure: CORONARY STENT INTERVENTION;  Surgeon: Tonny Bollman, MD;  Location: [facility] INVASIVE CV LAB;  Service: Cardiovascular;  Laterality: N/A;   LEFT HEART CATH AND CORONARY ANGIOGRAPHY N/A 06/07/2019   Procedure: LEFT HEART CATH AND CORONARY ANGIOGRAPHY;  Surgeon: Tonny Bollman, MD;  Location: [facility] INVASIVE CV LAB;  Service: Cardiovascular;  Laterality: N/A;    Current Medications: Current Meds  Medication Sig   aspirin EC 81 MG tablet Take 81 mg by mouth daily.     Allergies:   Patient has no known allergies.   Social History   Tobacco Use   Smoking status: Never   Smokeless tobacco: Never  Substance Use Topics   Alcohol use: No   Drug use: No     Family History: The patient's family history includes Heart disease in her daughter.  ROS:   Please see the history of present illness.    All other systems reviewed and are negative.  EKGs/Labs/Other Studies Reviewed:    The following studies were reviewed today:  Recent Labs: No results found for requested labs within last 365 days.  Recent Lipid Panel    Component Value Date/Time   CHOL 161 06/06/2019 0358   TRIG 37 06/06/2019 0358   HDL 68 06/06/2019 0358   CHOLHDL 2.4 06/06/2019 0358   VLDL 7 06/06/2019 0358   LDLCALC 86 06/06/2019 0358    Physical Exam:    VS:  BP  132/84   Pulse 71   Ht 5\' 1"  (1.549 m)   Wt 147 lb 6.4 oz (66.9 kg)   SpO2 98%   BMI 27.85 kg/m     Wt Readings from Last 5 Encounters:  11/03/22 147 lb 6.4 oz (66.9 kg)  03/05/21 141 lb 3.2 oz (64 kg)  08/10/20 138 lb (62.6 kg)  02/07/20 143 lb 9.6 oz (65.1 kg)  07/27/19 155 lb (70.3 kg)    Constitutional: No acute distress Eyes: sclera non-icteric, normal conjunctiva and lids ENMT: normal dentition, moist mucous membranes Cardiovascular: regular rhythm, normal rate,  no murmurs. S1 and S2 normal. Radial pulses normal bilaterally. No jugular venous distention.  Respiratory: clear to auscultation bilaterally GI : normal bowel sounds, soft and nontender. No distention.   MSK: extremities warm, well perfused. No edema.  NEURO: grossly nonfocal exam, moves all extremities. PSYCH: alert and oriented x 3, normal mood and affect.   ASSESSMENT:    1. Hyperlipidemia LDL goal <70   2. Coronary artery disease involving native coronary artery of native heart without angina pectoris   3. Essential hypertension   4. Syncope, unspecified syncope type   5. Non-ST elevation (NSTEMI) myocardial infarction [facility])     PLAN:    NSTEMI-status post PCI to the mid RCA.  Medical therapy includes DAPT x 12 mo. Plavix stopped 06/06/20 - continue ASA, BB, statin.    HTN - BP with good control, continue coreg 6.25 mg BID, losartan 25 mg daily.    HLD - continue crestor.   Total time of encounter: 30 minutes total time of encounter, including 20 minutes spent in face-to-face patient care on the date of this encounter. This time includes coordination of care and counseling regarding above mentioned problem list. Remainder of non-face-to-face time involved reviewing chart documents/testing relevant to the patient encounter and documentation in the medical record. I have independently reviewed documentation from referring provider.   Weston Brass, MD [place]  [facility]    Medication Adjustments/Labs and Tests Ordered: Current medicines are reviewed at length with the patient today.  Concerns regarding medicines are outlined above.   Orders Placed This Encounter  Procedures   EKG 12-Lead    Meds ordered this encounter  Medications   carvedilol (COREG) 6.25 MG tablet    Sig: Take 1 tablet (6.25 mg total) by mouth 2 (two) times daily with a meal.    Dispense:  180 tablet    Refill:  3   rosuvastatin (CRESTOR) 20 MG tablet    Sig: Take 1 tablet (20 mg total)  by mouth daily at 6 PM.    Dispense:  90 tablet    Refill:  3    Patient Instructions  Medication Instructions:  No Changes In Medications at this time.  *If you need a refill on your cardiac medications before your next appointment, please call your pharmacy*  Lab Work: None Ordered At This Time.  If you have labs (blood work) drawn today and your tests are completely normal, you will receive your results only by: MyChart Message (if you have MyChart) OR A paper copy in the mail If you have any lab test that is abnormal or we need to change your treatment, we will call you to review the results.  Testing/Procedures: None Ordered At This Time.   Follow-Up: At [facility], you and your health needs are our priority.  As part of our continuing mission to provide you with exceptional heart care, we have  created designated Provider Care Teams.  These Care Teams include your primary Cardiologist (physician) and Advanced Practice Providers (APPs -  Physician Assistants and Nurse Practitioners) who all work together to provide you with the care you need, when you need it.  Your next appointment:   1 year(s)  Provider:   Parke Poisson, MD

## 2022-11-03 NOTE — Patient Instructions (Signed)
Medication Instructions:  No Changes In Medications at this time.  *If you need a refill on your cardiac medications before your next appointment, please call your pharmacy*  Lab Work: None Ordered At This Time.  If you have labs (blood work) drawn today and your tests are completely normal, you will receive your results only by: MyChart Message (if you have MyChart) OR A paper copy in the mail If you have any lab test that is abnormal or we need to change your treatment, we will call you to review the results.  Testing/Procedures: None Ordered At This Time.   Follow-Up: At [facility], you and your health needs are our priority.  As part of our continuing mission to provide you with exceptional heart care, we have created designated Provider Care Teams.  These Care Teams include your primary Cardiologist (physician) and Advanced Practice Providers (APPs -  Physician Assistants and Nurse Practitioners) who all work together to provide you with the care you need, when you need it.  Your next appointment:   1 year(s)  Provider:   Gayatri A Acharya, MD     

## 2023-01-21 ENCOUNTER — Encounter: Payer: Self-pay | Admitting: Podiatry

## 2023-01-21 ENCOUNTER — Ambulatory Visit: Payer: Medicare Other | Admitting: Podiatry

## 2023-01-21 DIAGNOSIS — I739 Peripheral vascular disease, unspecified: Secondary | ICD-10-CM

## 2023-01-21 DIAGNOSIS — M79675 Pain in left toe(s): Secondary | ICD-10-CM

## 2023-01-21 DIAGNOSIS — M79674 Pain in right toe(s): Secondary | ICD-10-CM | POA: Diagnosis not present

## 2023-01-21 DIAGNOSIS — B351 Tinea unguium: Secondary | ICD-10-CM

## 2023-01-21 NOTE — Progress Notes (Signed)

## 2023-07-27 ENCOUNTER — Encounter: Payer: Self-pay | Admitting: Podiatry

## 2023-07-27 ENCOUNTER — Ambulatory Visit: Payer: Medicare Other | Admitting: Podiatry

## 2023-07-27 VITALS — Ht 61.0 in | Wt 147.0 lb

## 2023-07-27 DIAGNOSIS — M79674 Pain in right toe(s): Secondary | ICD-10-CM | POA: Diagnosis not present

## 2023-07-27 DIAGNOSIS — M79675 Pain in left toe(s): Secondary | ICD-10-CM | POA: Diagnosis not present

## 2023-07-27 DIAGNOSIS — B351 Tinea unguium: Secondary | ICD-10-CM | POA: Diagnosis not present

## 2023-07-28 NOTE — Progress Notes (Signed)
Subjective:   Patient ID: Daisy Becker, female   DOB: 89 y.o.   MRN: [id number]   HPI Patient presents with caregiver with significant elongation thickness of nailbeds 1-5 both feet painful   ROS      Objective:  Physical Exam  Mycotic nail infection thick yellow brittle nailbeds 1-5 both feet that are painful and impossible for her to cut painful mycotic     Assessment:  Painful mycotic nail infection 1-5 both feet with pain     Plan:  H&P reviewed debrided nailbeds 1-5 both feet no iatrogenic bleeding reappoint routine care

## 2023-11-18 ENCOUNTER — Other Ambulatory Visit: Payer: Self-pay | Admitting: Internal Medicine

## 2023-11-19 ENCOUNTER — Ambulatory Visit: Payer: Medicare Other | Admitting: Internal Medicine

## 2023-11-27 ENCOUNTER — Ambulatory Visit: Payer: Medicare Other | Attending: Internal Medicine | Admitting: Internal Medicine

## 2023-11-27 VITALS — BP 138/70 | HR 63 | Ht 61.0 in | Wt 150.4 lb

## 2023-11-27 DIAGNOSIS — R55 Syncope and collapse: Secondary | ICD-10-CM

## 2023-11-27 DIAGNOSIS — E7800 Pure hypercholesterolemia, unspecified: Secondary | ICD-10-CM

## 2023-11-27 DIAGNOSIS — I2510 Atherosclerotic heart disease of native coronary artery without angina pectoris: Secondary | ICD-10-CM

## 2023-11-27 DIAGNOSIS — I10 Essential (primary) hypertension: Secondary | ICD-10-CM

## 2023-11-27 DIAGNOSIS — I214 Non-ST elevation (NSTEMI) myocardial infarction: Secondary | ICD-10-CM

## 2023-11-27 DIAGNOSIS — Z79899 Other long term (current) drug therapy: Secondary | ICD-10-CM

## 2023-11-27 DIAGNOSIS — E785 Hyperlipidemia, unspecified: Secondary | ICD-10-CM

## 2023-11-27 MED ORDER — NITROGLYCERIN 0.4 MG SL SUBL: 0.4000 mg | SUBLINGUAL_TABLET | SUBLINGUAL | 3 refills | Status: AC | PRN

## 2023-11-27 NOTE — Progress Notes (Signed)
 Cardiology Office Note:  .   Date:  11/27/2023  ID:  Daisy Becker, DOB 03/03/1925, MRN [id number] PCP: Daisy Carrier, MD  [facility] Providers Cardiologist:  Daisy Herrlich, MD    History of Present Illness: Daisy Becker   Daisy Becker is a 89 y.o. female.  Discussed the use of AI scribe software for clinical note transcription with the patient, who gave verbal consent to proceed.  History of Present Illness Daisy Becker is a 89 year old female with hypertension and coronary artery disease who presents for a cardiovascular follow-up.  Her blood pressure recently increased to 180/88 mmHg, leading to an increase in losartan  from 25 mg to 50 mg daily, which caused fatigue. She reverted to 25 mg daily, with current readings around 130-150 mmHg. She occasionally checks her blood pressure at home but is generally apprehensive about doing so.  She has coronary artery disease with a history of myocardial infarction and stent placement. Her current medications include rosuvastatin  20 mg daily, carvedilol  6.25 mg twice daily, and a daily baby aspirin . She has not used nitroglycerin  recently and reports no chest pain or dyspnea.  She is experiencing emotional stress due to her daughter Daisy Becker's recent passing, affecting her appetite and leading to weight gain, which concerns her in relation to blood pressure and cholesterol management.    ROS: negative except per HPI above.  Studies Reviewed: Daisy Becker   EKG Interpretation Date/Time:  Friday Nov 27 2023 09:08:23 EDT Ventricular Rate:  64 PR Interval:  200 QRS Duration:  88 QT Interval:  406 QTC Calculation: 418 R Axis:   73  Text Interpretation: Normal sinus rhythm Minimal voltage criteria for LVH, may be normal variant ( Cornell product ) When compared with ECG of 27-Jun-2020 19:55, No significant change was found Confirmed by Grady Lawman ([id number]) on 11/27/2023 9:38:10 AM    Results DIAGNOSTIC EKG: Normal sinus rhythm, mild  left ventricular hypertrophy (11/27/2023) Endoscopy: Normal Colonoscopy: Normal Risk Assessment/Calculations:       Physical Exam:   VS:  BP 138/70 (BP Location: Left Arm, Patient Position: Sitting, Cuff Size: Normal)   Pulse 63   Ht 5\' 1"  (1.549 m)   Wt 150 lb 6.4 oz (68.2 kg)   SpO2 97%   BMI 28.42 kg/m    Wt Readings from Last 3 Encounters:  11/27/23 150 lb 6.4 oz (68.2 kg)  07/27/23 147 lb (66.7 kg)  11/03/22 147 lb 6.4 oz (66.9 kg)     Physical Exam GENERAL: Alert, cooperative, well developed, no acute distress. HEENT: Normocephalic, normal oropharynx, moist mucous membranes. CHEST: Clear to auscultation bilaterally, no wheezes, rhonchi, or crackles. CARDIOVASCULAR: Normal heart rate and rhythm, S1 and S2 normal without murmurs. ABDOMEN: Soft, non-tender, non-distended, without organomegaly, normal bowel sounds. EXTREMITIES: No cyanosis or edema. NEUROLOGICAL: Cranial nerves grossly intact, moves all extremities without gross motor or sensory deficit.   ASSESSMENT AND PLAN: .    Assessment and Plan Assessment & Plan Hypertension Hypertension managed with losartan . Current readings acceptable for age. Risk of hypotension and falls considered. Stroke risk not concerning at current levels. - Continue losartan  25 mg daily. - Advise additional losartan  up to 50 mg if blood pressure is very high and concerning. - Monitor blood pressure at home, especially if readings exceed 150. - Discussed that blood pressure between 130-150 is acceptable at age 73.  CAD NSTEMI and stent placement Med mgmt Post-heart attack with stent placement. Asymptomatic, no chest pain. Statin therapy for plaque stabilization. - Continue rosuvastatin   20 mg daily. - Refill nitroglycerin  prescription. - Continue baby aspirin  daily s/p pci. No bleeding.  Syncope No recurrence  Hyperlipidemia Hyperlipidemia managed with rosuvastatin . No side effects. Statin therapy for plaque stabilization. -  Continue rosuvastatin  20 mg daily.        Grady Lawman, MD, FACC

## 2023-11-27 NOTE — Patient Instructions (Signed)
 Medication Instructions:  No Changes *If you need a refill on your cardiac medications before your next appointment, please call your pharmacy*  Lab Work: None  Follow-Up: At [facility], you and your health needs are our priority.  As part of our continuing mission to provide you with exceptional heart care, our providers are all part of one team.  This team includes your primary Cardiologist (physician) and Advanced Practice Providers or APPs (Physician Assistants and Nurse Practitioners) who all work together to provide you with the care you need, when you need it.  Your next appointment:   1 year(s)  Provider:   Gayatri A Acharya, MD    Other Instructions Please call us  or send a MyChart message with any Cardiology related questions/concerns.  [phone].  Thank you!

## 2023-12-28 ENCOUNTER — Ambulatory Visit: Admitting: Podiatry

## 2024-02-19 NOTE — Telephone Encounter (Signed)
 Last Rx:11/18/23 LOV:11/09/23 NOV:05/11/24  Rx approved per protocol and LOV note

## 2024-06-07 ENCOUNTER — Ambulatory Visit: Admitting: Podiatry

## 2024-06-09 ENCOUNTER — Ambulatory Visit
Admission: RE | Admit: 2024-06-09 | Discharge: 2024-06-09 | Disposition: A | Discharge status: Home / Self Care | Source: Ambulatory Visit

## 2024-06-09 VITALS — BP 175/91 | HR 71 | Temp 97.5°F | Resp 18 | Wt 150.4 lb

## 2024-06-09 DIAGNOSIS — R051 Acute cough: Secondary | ICD-10-CM

## 2024-06-09 LAB — POC COVID19/FLU A&B COMBO
Covid Antigen, POC: NEGATIVE
Influenza A Antigen, POC: NEGATIVE
Influenza B Antigen, POC: NEGATIVE

## 2024-06-09 MED ORDER — BENZONATATE 100 MG PO CAPS: 100.0000 mg | ORAL_CAPSULE | Freq: Three times a day (TID) | ORAL | 0 refills | Status: AC

## 2024-06-09 NOTE — ED Triage Notes (Signed)
 Pt presents c/o dry cough x 2 days. Pt states,  I think it started on Tuesday and now it has gotten worse.   Pt denies any additional sxs.

## 2024-06-09 NOTE — ED Provider Notes (Signed)
 [facility]    CSN: [id number] Arrival date & time: 06/09/24  1339      History   Chief Complaint Chief Complaint  Patient presents with   Cough    Entered by patient    HPI Daisy Becker is a 89 y.o. female.   Pt presents today due to 2 days worth of cough productive of pale yellow sputum. Pt denies chest tightness, shortness of breath, wheezing, nausea,vomiting, fever, or chills. Pt states that she tried lemon and honey in her tea for symptoms. Pt denies known sick contacts.   The history is provided by the patient.  Cough   Past Medical History:  Diagnosis Date   CAD (coronary artery disease)    s/p mRCA DES 06/07/2019, 50% prox RCA, prox LCx, prox LAD and mid LAD residual, 40% D1.   Hyperlipidemia LDL goal <70    Hypertension     Patient Active Problem List   Diagnosis Date Noted   NSTEMI (non-ST elevated myocardial infarction) (HCC) 06/05/2019   Essential hypertension    Pure hypercholesterolemia    Asymptomatic cholelithiasis 06/17/2011   Other and unspecified hyperlipidemia 05/15/2011   Melanoma in situ (HCC) 05/15/2011   History of gastroesophageal reflux (GERD) 05/15/2011    Past Surgical History:  Procedure Laterality Date   CORONARY STENT INTERVENTION N/A 06/07/2019   Procedure: CORONARY STENT INTERVENTION;  Surgeon: Wonda Sharper, MD;  Location: [facility] INVASIVE CV LAB;  Service: Cardiovascular;  Laterality: N/A;   LEFT HEART CATH AND CORONARY ANGIOGRAPHY N/A 06/07/2019   Procedure: LEFT HEART CATH AND CORONARY ANGIOGRAPHY;  Surgeon: Wonda Sharper, MD;  Location: [facility] INVASIVE CV LAB;  Service: Cardiovascular;  Laterality: N/A;    OB History   No obstetric history on file.      Home Medications    Prior to Admission medications   Medication Sig Start Date End Date Taking? Authorizing Provider  benzonatate  (TESSALON ) 100 MG capsule Take 1 capsule (100 mg total) by mouth every 8 (eight) hours. 06/09/24  Yes Andra Krabbe C, PA-C   cephALEXin (KEFLEX) 500 MG capsule Take 500 mg by mouth 2 (two) times daily. 05/25/24  Yes [provider]  aspirin  EC 81 MG tablet Take 81 mg by mouth daily.    [provider]  carvedilol  (COREG ) 6.25 MG tablet Take 1 tablet (6.25 mg total) by mouth 2 (two) times daily with a meal. 11/03/22   Loni Soyla LABOR, MD  cholecalciferol (VITAMIN D3) 25 MCG (1000 UNIT) tablet Take 1,000 Units by mouth daily.    [provider]  losartan  (COZAAR ) 50 MG tablet Take 25 mg by mouth daily. 11/09/23   [provider]  magnesium oxide (MAG-OX) 400 (240 Mg) MG tablet Take 400 mg by mouth daily.    [provider]  nitroGLYCERIN  (NITROSTAT ) 0.4 MG SL tablet Place 1 tablet (0.4 mg total) under the tongue every 5 (five) minutes as needed for chest pain. 11/27/23   Acharya, Gayatri A, MD  rosuvastatin  (CRESTOR ) 20 MG tablet Take 1 tablet (20 mg total) by mouth daily at 6 PM. 11/03/22   Loni Soyla LABOR, MD  Skin Protectants, Misc. (AQUAGARD HYDRATING) 41 % OINT Apply 1 Application topically.    [provider]    Family History Family History  Problem Relation Age of Onset   Heart disease Daughter        pacemaker    Social History Social History   Tobacco Use   Smoking status: Never   Smokeless tobacco:  Never  Vaping Use   Vaping status: Never Used  Substance Use Topics   Alcohol use: No   Drug use: No     Allergies   Patient has no known allergies.   Review of Systems Review of Systems  Respiratory:  Positive for cough.      Physical Exam Triage Vital Signs ED Triage Vitals  Encounter Vitals Group     BP 06/09/24 1416 (!) 175/91     Girls Systolic BP Percentile --      Girls Diastolic BP Percentile --      Boys Systolic BP Percentile --      Boys Diastolic BP Percentile --      Pulse Rate 06/09/24 1416 71     Resp 06/09/24 1416 18     Temp 06/09/24 1416 (!) 97.5 F (36.4 C)     Temp Source 06/09/24 1416 Oral     SpO2  06/09/24 1416 97 %     Weight 06/09/24 1415 150 lb 5.7 oz (68.2 kg)     Height --      Head Circumference --      Peak Flow --      Pain Score 06/09/24 1414 0     Pain Loc --      Pain Education --      Exclude from Growth Chart --    No data found.  Updated Vital Signs BP (!) 175/91 (BP Location: Left Arm)   Pulse 71   Temp (!) 97.5 F (36.4 C) (Oral)   Resp 18   Wt 150 lb 5.7 oz (68.2 kg)   SpO2 97%   BMI 28.41 kg/m   Visual Acuity Right Eye Distance:   Left Eye Distance:   Bilateral Distance:    Right Eye Near:   Left Eye Near:    Bilateral Near:     Physical Exam Vitals and nursing note reviewed.  Constitutional:      General: She is not in acute distress.    Appearance: Normal appearance. She is not ill-appearing, toxic-appearing or diaphoretic.  HENT:     Nose: No congestion or rhinorrhea.     Mouth/Throat:     Mouth: Mucous membranes are moist.     Pharynx: Oropharynx is clear. No oropharyngeal exudate or posterior oropharyngeal erythema.  Eyes:     General: No scleral icterus. Cardiovascular:     Rate and Rhythm: Normal rate and regular rhythm.     Heart sounds: Normal heart sounds.  Pulmonary:     Effort: Pulmonary effort is normal. No respiratory distress.     Breath sounds: Normal breath sounds. No wheezing or rhonchi.  Skin:    General: Skin is warm.  Neurological:     Mental Status: She is alert and oriented to person, place, and time.  Psychiatric:        Mood and Affect: Mood normal.        Behavior: Behavior normal.      UC Treatments / Results  Labs (all labs ordered are listed, but only abnormal results are displayed) Labs Reviewed  POC COVID19/FLU A&B COMBO - Normal    EKG   Radiology No results found.  Procedures Procedures (including critical care time)  Medications Ordered in UC Medications - No data to display  Initial Impression / Assessment and Plan / UC Course  I have reviewed the triage vital signs and the  nursing notes.  Pertinent labs & imaging results that were available during my care  of the patient were reviewed by me and considered in my medical decision making (see chart for details).   Final Clinical Impressions(s) / UC Diagnoses   Final diagnoses:  Acute cough     Discharge Instructions      You been diagnosed with a viral illness today. -Viruses have to run their course and medicines that are prescribed are meant to help with symptoms. - With viruses usually feel poorly from 3 to 7 days with cough being the last symptoms to resolve.  -Cough can linger from days to weeks.  Antibiotics are not effective for viruses. -If your cough lasts more than 2 weeks and you are coughing so hard that you are vomiting or feel like you could pass out we need to follow-up with PCP for further testing and evaluation. -Rest, increase water intake, may use pseudoephedrine for nasal congestion, Delsym (dextromethorphan) or honey as needed for cough, and ibuprofen and/or Tylenol  as directed on packaging for pain and fever. -If you have hypertension you should take Coricidin or other OTC meds approved for people with high blood pressure. -You may use a spoonful of honey every 4-6 hours as needed for throat pain and cough. -Warm tea with honey and lemon are helpful for soothe throat as well.  Chloraseptic and Cepacol make a throat lozenge with numbing medication, can be purchased over-the-counter. -May also use Flonase or sinus rinse for sinus pressure or nasal congestion.  Be sure to use distilled bottled water for sinus rinses. -May use coolmist humidifier to open up nasal passages -May elevate head to assist with postnasal drainage. -If you feel poorly (fever, fatigue, shortness of breath, nausea, etc.) for more than 10 days to be sure to follow-up with PCP or in clinic for further evaluation and additional treatments. If you experience chest pain with shortness of breath or pulse oxygen less than 95%  you should report to the ER.     ED Prescriptions     Medication Sig Dispense Auth. Provider   benzonatate  (TESSALON ) 100 MG capsule Take 1 capsule (100 mg total) by mouth every 8 (eight) hours. 30 capsule Andra Corean BROCKS, PA-C      PDMP not reviewed this encounter.   Andra Corean BROCKS, PA-C 06/09/24 1607

## 2024-06-09 NOTE — Discharge Instructions (Signed)

## 2024-07-06 ENCOUNTER — Encounter: Payer: Self-pay | Admitting: Podiatry

## 2024-07-06 ENCOUNTER — Ambulatory Visit: Admitting: Podiatry

## 2024-07-06 DIAGNOSIS — M79674 Pain in right toe(s): Secondary | ICD-10-CM

## 2024-07-06 DIAGNOSIS — M79675 Pain in left toe(s): Secondary | ICD-10-CM

## 2024-07-06 DIAGNOSIS — B351 Tinea unguium: Secondary | ICD-10-CM

## 2024-07-06 DIAGNOSIS — I739 Peripheral vascular disease, unspecified: Secondary | ICD-10-CM

## 2024-07-06 NOTE — Progress Notes (Signed)
"  °  Subjective:  Patient ID: Daisy Becker, female    DOB: 1925/03/18,   MRN: [id number]  Chief Complaint  Patient presents with   rfc    Patient presents today for RFC no changes since last medical visit .    89 y.o. female presents for concern of thickened elongated and painful nails that are difficult to trim. Requesting to have them trimmed today. Relates burning and tingling in their feet. Patient is diabetic and last A1c was  Lab Results  Component Value Date   HGBA1C 6.0 (H) 06/06/2019  History of PVD  .   PCP:  Lemuel Pringle, MD    . Denies any other pedal complaints. Denies n/v/f/c.   Past Medical History:  Diagnosis Date   CAD (coronary artery disease)    s/p mRCA DES 06/07/2019, 50% prox RCA, prox LCx, prox LAD and mid LAD residual, 40% D1.   Hyperlipidemia LDL goal <70    Hypertension     Objective:  Physical Exam: Vascular: DP/PT pulses 1/4 bilateral. CFT <3 seconds. Feet cold to touch. Absent hair growth on digits. Edema noted to bilateral lower extremities. Xerosis noted bilaterally.  Skin. No lacerations or abrasions bilateral feet. Nails 1-5 bilateral  are thickened discolored and elongated with subungual debris.  Musculoskeletal: MMT 5/5 bilateral lower extremities in DF, PF, Inversion and Eversion. Deceased ROM in DF of ankle joint.  Neurological: Sensation intact to light touch. Protective sensation diminished bilateral.    Assessment:   1. Pain due to onychomycosis of toenails of both feet   2. PVD (peripheral vascular disease)      Plan:  Patient was evaluated and treated and all questions answered. -Discussed and educated patient on diabetic foot care, especially with  regards to the vascular, neurological and musculoskeletal systems.  -Stressed the importance of good glycemic control and the detriment of not  controlling glucose levels in relation to the foot. -Discussed supportive shoes at all times and checking feet regularly.  -Mechanically  debrided all nails 1-5 bilateral using sterile nail nipper and filed with dremel without incident  -Answered all patient questions -Patient to return  in 3 months for at risk foot care -Patient advised to call the office if any problems or questions arise in the meantime.   Asberry Failing, DPM    "

## 2024-07-20 ENCOUNTER — Encounter: Payer: Self-pay | Admitting: Internal Medicine

## 2024-07-20 NOTE — Telephone Encounter (Signed)
 Error

## 2024-07-20 NOTE — Progress Notes (Signed)
 " Cardiology Office Note:  .   Date:  07/21/2024  ID:  Daisy Becker, DOB [birth year], MRN [id number] PCP: Lemuel Pringle, MD  [facility] Providers Cardiologist:  Soyla DELENA Merck, MD    History of Present Illness: Daisy Becker is a 89 y.o. female.  Discussed the use of AI scribe software for clinical note transcription with the patient, who gave verbal consent to proceed.  History of Present Illness Daisy Becker is a 89 year old female with hypertension who presents with elevated blood pressure.  She has had persistent elevated blood pressure, with home readings around 150-160 mmHg and occasional reports up to 160 mmHg, and clinic readings near 200 mmHg.  In November, she was hospitalized for severe hypertension with readings around 180-190 mmHg and associated chest discomfort. Blood pressure fluctuated during that admission but stabilized before discharge.  She has not taken her prn hydralazine  regularly because she feels well. She remains able to perform daily activities without limitation.  She avoids adding salt to her meals and reports no recent dietary changes relevant to her elevated blood pressure.  From PCP: Hypertension: - Blood pressure readings have been consistently elevated, with systolic values exceeding 200 and diastolic values ranging from 50s to 60s. - A recent reading at an urgent care visit was 175/91. - Hydralazine  10 mg has been prescribed to be taken as needed when systolic blood pressure exceeds 180 or diastolic blood pressure surpasses 110, up to twice daily if necessary. - Continue current regimen of carvedilol  6.25 mg twice daily and losartan  50 mg daily.  ROS: negative except per HPI above.  Studies Reviewed: .        Results Labs Renal function (05/24/2024): Within normal limits Risk Assessment/Calculations:       Physical Exam:   VS:  BP (!) 214/84   Pulse 70   Ht 5' (1.524 m)   Wt 143 lb 1.6 oz (64.9 kg)   SpO2  98%   BMI 27.95 kg/m    Wt Readings from Last 3 Encounters:  07/21/24 143 lb 1.6 oz (64.9 kg)  06/09/24 150 lb 5.7 oz (68.2 kg)  11/27/23 150 lb 6.4 oz (68.2 kg)     Physical Exam GENERAL: Alert, cooperative, well developed, no acute distress HEENT: Normocephalic, normal oropharynx, moist mucous membranes CHEST: Clear to auscultation bilaterally, no wheezes, rhonchi, or crackles CARDIOVASCULAR: Normal heart rate and rhythm, S1 and S2 normal without murmurs ABDOMEN: Soft, non-tender, non-distended, without organomegaly, normal bowel sounds EXTREMITIES: No cyanosis or edema NEUROLOGICAL: Cranial nerves grossly intact, moves all extremities without gross motor or sensory deficit   ASSESSMENT AND PLAN: .    Assessment and Plan Assessment & Plan Hypertensive urgency with episodic severe elevations Intermittent elevated blood pressure up to 200 mmHg systolic. No current symptoms. Discussed risks of overmedication and syncope. Emphasized monitoring and judicious use of medication. Explained hydralazine  side effects and safety. Encouraged nitroglycerin  for chest pain. - Continue hydralazine  10 mg BID PRN for blood pressure over 180/110 mmHg. BP goal is 130-150 mmHg systolic given age.  - continue losartan  50 mg daily and coreg  6.25 mg bid - Use nitroglycerin  as needed for chest pain. - Monitor blood pressure regularly. - Avoid daily use of hydralazine  unless necessary given prior vasovagal/orthostatic syncope.  - BP did not come down on recheck despite using hydralazine  10 mg for the first time. Recheck when she gets home.   CAD NSTEMI and stent placement Post-heart attack with stent placement. Asymptomatic, no chest pain.  Statin therapy for plaque stabilization. - Continue rosuvastatin  20 mg daily. - Continue baby aspirin  daily s/p pci. No bleeding.   Syncope No recurrence   Hyperlipidemia Hyperlipidemia managed with rosuvastatin . No side effects. Statin therapy for plaque  stabilization. - Continue rosuvastatin  20 mg daily.          Soyla Merck, MD, [facility] "

## 2024-07-21 ENCOUNTER — Ambulatory Visit: Attending: Internal Medicine | Admitting: Internal Medicine

## 2024-07-21 ENCOUNTER — Encounter: Payer: Self-pay | Admitting: Internal Medicine

## 2024-07-21 VITALS — BP 214/84 | HR 70 | Ht 60.0 in | Wt 143.1 lb

## 2024-07-21 DIAGNOSIS — I214 Non-ST elevation (NSTEMI) myocardial infarction: Secondary | ICD-10-CM | POA: Diagnosis not present

## 2024-07-21 DIAGNOSIS — R55 Syncope and collapse: Secondary | ICD-10-CM

## 2024-07-21 DIAGNOSIS — E785 Hyperlipidemia, unspecified: Secondary | ICD-10-CM | POA: Diagnosis not present

## 2024-07-21 DIAGNOSIS — I2510 Atherosclerotic heart disease of native coronary artery without angina pectoris: Secondary | ICD-10-CM

## 2024-07-21 DIAGNOSIS — E7800 Pure hypercholesterolemia, unspecified: Secondary | ICD-10-CM

## 2024-07-21 DIAGNOSIS — I10 Essential (primary) hypertension: Secondary | ICD-10-CM

## 2024-07-21 NOTE — Patient Instructions (Signed)
 Medication Instructions:  No Changes  Lab Work: None  Follow-Up: At [facility], you and your health needs are our priority.  As part of our continuing mission to provide you with exceptional heart care, our providers are all part of one team.  This team includes your primary Cardiologist (physician) and Advanced Practice Providers or APPs (Physician Assistants and Nurse Practitioners) who all work together to provide you with the care you need, when you need it.  Your next appointment:    June 2026  (stop by check out dest to make appointment)  Provider:   Gayatri A Acharya, MD

## 2024-12-27 ENCOUNTER — Ambulatory Visit: Admitting: Internal Medicine
